# Patient Record
Sex: Female | Born: 1995 | Race: White | Hispanic: No | Marital: Single | Smoking: Former smoker
Health system: Southern US, Community
[De-identification: ages and names within clinical notes are randomized; demographics above are authoritative.]

## PROBLEM LIST (undated history)

## (undated) DIAGNOSIS — F419 Anxiety disorder, unspecified: Secondary | ICD-10-CM

## (undated) DIAGNOSIS — Z789 Other specified health status: Secondary | ICD-10-CM

## (undated) DIAGNOSIS — F32A Depression, unspecified: Secondary | ICD-10-CM

## (undated) DIAGNOSIS — F329 Major depressive disorder, single episode, unspecified: Secondary | ICD-10-CM

---
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
## 2017-02-06 ENCOUNTER — Other Ambulatory Visit (HOSPITAL_COMMUNITY): Payer: Self-pay | Admitting: Psychiatry

## 2017-02-06 DIAGNOSIS — F331 Major depressive disorder, recurrent, moderate: Secondary | ICD-10-CM

## 2017-02-11 ENCOUNTER — Encounter (HOSPITAL_COMMUNITY): Payer: Self-pay | Admitting: Psychiatry

## 2017-02-11 ENCOUNTER — Ambulatory Visit (INDEPENDENT_AMBULATORY_CARE_PROVIDER_SITE_OTHER): Payer: Medicaid Other | Admitting: Psychiatry

## 2017-02-11 VITALS — BP 118/68 | HR 79 | Ht 65.0 in | Wt 161.0 lb

## 2017-02-11 DIAGNOSIS — F419 Anxiety disorder, unspecified: Secondary | ICD-10-CM

## 2017-02-11 DIAGNOSIS — F5101 Primary insomnia: Secondary | ICD-10-CM

## 2017-02-11 DIAGNOSIS — Z818 Family history of other mental and behavioral disorders: Secondary | ICD-10-CM

## 2017-02-11 DIAGNOSIS — F1220 Cannabis dependence, uncomplicated: Secondary | ICD-10-CM

## 2017-02-11 DIAGNOSIS — F17210 Nicotine dependence, cigarettes, uncomplicated: Secondary | ICD-10-CM | POA: Diagnosis not present

## 2017-02-11 DIAGNOSIS — F331 Major depressive disorder, recurrent, moderate: Secondary | ICD-10-CM | POA: Diagnosis not present

## 2017-02-11 MED ORDER — BUSPIRONE HCL 15 MG PO TABS: 15.0000 mg | ORAL_TABLET | Freq: Two times a day (BID) | ORAL | 2 refills | Status: DC

## 2017-02-11 MED ORDER — VENLAFAXINE HCL ER 75 MG PO CP24: 75.0000 mg | ORAL_CAPSULE | Freq: Every day | ORAL | 1 refills | Status: DC

## 2017-02-11 MED ORDER — TRAZODONE HCL 50 MG PO TABS: 50.0000 mg | ORAL_TABLET | Freq: Every day | ORAL | 1 refills | Status: DC

## 2017-02-11 NOTE — Progress Notes (Signed)
BH MD/PA/NP OP Progress Note  02/11/2017 3:16 PM Ariana Burns  MRN:  [id number]  Chief Complaint: med management, transfer care  Subjective:  Ariana Burns presents today for transfer of care from Dr. Lucianne Burns. I spent time getting to know Ariana Burns, and his gender identity. 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    Spent time learning about his mental health history and his areas of struggle. He feels that Effexor is generally effective, but would like to go down on the dose from 150 to 75 due to side effects, namely headache, low sex drive. Reports that he has done perfectly fine in the past with 75 mg XR, and feels that his life is at a stable place and he can tolerate the decrease.  He reports that he uses marijuana every night to go to sleep, so he has been cutting down on his trazodone.   Spent time discussing the risks and benefits of marijuana, and my concerns about any type of substance that comes to be used daily. He was agreeable to continue this discussion at our follow-up visits.  He denies any alcohol use. He reports that the marijuana is helpful for his back pain, low appetite, and sleep. He is agreeable to starting to taper BuSpar and trazodone, given that he notices he doesn't need these is much.  No acute safety issues and agrees to follow-up in 3 months.  He would like to establish therapy, and agrees to a referral to see a provider in this clinic.    Visit Diagnosis:    ICD-10-CM   1. Major depressive disorder, recurrent episode, moderate (HCC) F33.1 venlafaxine XR (EFFEXOR XR) 75 MG 24 hr capsule    busPIRone (BUSPAR) 15 MG tablet  2. Primary insomnia F51.01 traZODone (DESYREL) 50 MG tablet  3. Cannabis use disorder, moderate, dependence (HCC) F12.20     Past Psychiatric History: See intake H&P for full details. Reviewed, with no updates at this time.   Past Medical History:   Past Medical History:  Diagnosis Date  . Medical history non-contributory    History reviewed. No pertinent surgical history.  Family Psychiatric History: See intake H&P for full details. Reviewed, with no updates at this time.   Family History:  Family History  Problem Relation Age of Onset  . Hypertension Maternal Grandfather   . Depression Mother   . Depression Father     Social History:  Social History   Social History  . Marital status: Single    Spouse name: N/A  . Number of children: N/A  . Years of education: N/A   Social History Main Topics  . Smoking status: Current Every Day Smoker    Packs/day: 0.25    Types: Cigarettes  . Smokeless tobacco: Never Used  . Alcohol use No  . Drug use: No  . Sexual activity: No   Other Topics Concern  . None   Social History Narrative  . None    Allergies: No Known Allergies  Metabolic Disorder Labs: No results found for: HGBA1C, MPG Lab Results  Component Value Date   PROLACTIN 25.6 07/11/2014   PROLACTIN 32.8 07/06/2014   Lab Results  Component Value Date   CHOL 127 07/06/2014   TRIG 58 07/06/2014   HDL 51 07/06/2014   CHOLHDL 2.5 07/06/2014   VLDL 12 07/06/2014   LDLCALC 64 07/06/2014     Current Medications: Current Outpatient Prescriptions  Medication Sig Dispense Refill  . busPIRone (BUSPAR) 15 MG tablet Take 1 tablet (15 mg total) by mouth 2 (two) times daily. 30 tablet 2  . testosterone cypionate (DEPOTESTOTERONE CYPIONATE) 100 MG/ML injection Inject into the muscle every 14 (fourteen) days. For IM use only    . traZODone (DESYREL) 50 MG tablet Take 1 tablet (50 mg total) by mouth at bedtime. 90 tablet 1  . venlafaxine XR (EFFEXOR XR) 75 MG 24 hr capsule Take 1 capsule (75 mg total) by mouth daily. 90 capsule 1   No current facility-administered medications for this visit.     Neurologic: Headache: Negative Seizure: Negative Paresthesias: Negative  Musculoskeletal: Strength & Muscle Tone:  within normal limits Gait & Station: normal Patient leans: N/A  Psychiatric Specialty Exam: ROS  Blood pressure 118/68, pulse 79, height 5\' 5"  (1.651 m), weight 161 lb (73 kg).Body mass index is 26.79 kg/m.  General Appearance: Casual and Fairly Groomed  Eye Contact:  Good  Speech:  Clear and Coherent  Volume:  Normal  Mood:  Euthymic  Affect:  Congruent  Thought Process:  Goal Directed  Orientation:  Full (Time, Place, and Person)  Thought Content: Logical   Suicidal Thoughts:  No  Homicidal Thoughts:  No  Memory:  Immediate;   Fair  Judgement:  Fair  Insight:  Fair  Psychomotor Activity:  Normal  Concentration:  Concentration: Fair  Recall:  Fair  Fund of Knowledge: Good  Language: Good  Akathisia:  NA  Handed:  Right  AIMS (if indicated):  0  Assets:  Communication Skills Desire for Improvement Financial Resources/Insurance Housing Social Support Transportation Vocational/Educational  ADL's:  Intact  Cognition: WNL  Sleep:  7-8 hours   Treatment Plan Summary: Ariana Burns, goes by Ariana Burns, Is an 21 year old transgender female, currently on hormone replacement therapy, who presents today for a transfer of his psychiatric care.   He lives with his grandparents nearby and works in [place] [employer].  Spent time today discussing his medication regimen, and learning about his social life and future goals.  He is currently in a relationship with a female partner.  No acute safety issues, and he would like to establish therapy care to work on coping strategies, and learning self growth techniques.  Agrees to follow-up in 3 months.  1. Major depressive disorder, recurrent episode, moderate (HCC)   2. Primary insomnia   3. Cannabis use disorder, moderate, dependence (HCC)     Ongoing discussion of cannabis use Continue Effexor 75 mg XR daily Trazodone 25-50 mg nightly at bedtime BuSpar 15-30 mg daily for anxiety Return to clinic in 3 months Therapy follow-up in this  office  Burnard LeighAlexander Arya Shernita Rabinovich, MD 02/11/2017, 3:16 PM

## 2017-03-13 ENCOUNTER — Other Ambulatory Visit: Payer: Self-pay | Admitting: Internal Medicine

## 2017-03-13 DIAGNOSIS — M546 Pain in thoracic spine: Secondary | ICD-10-CM

## 2017-03-13 DIAGNOSIS — M419 Scoliosis, unspecified: Secondary | ICD-10-CM

## 2017-03-26 ENCOUNTER — Other Ambulatory Visit: Payer: Self-pay

## 2017-04-03 ENCOUNTER — Ambulatory Visit (HOSPITAL_COMMUNITY): Payer: Self-pay | Admitting: Psychology

## 2017-04-03 ENCOUNTER — Encounter (HOSPITAL_COMMUNITY): Payer: Self-pay | Admitting: Psychology

## 2017-04-03 NOTE — Progress Notes (Signed)
Ariana CrazeCierra Monteith is a 21 y.o. transgender female patient who didn't show for his appointment.  Letter sent.        Forde RadonYATES,Patriciaann Rabanal, LPC

## 2017-05-14 ENCOUNTER — Ambulatory Visit (HOSPITAL_COMMUNITY): Payer: Self-pay | Admitting: Psychiatry

## 2017-06-17 ENCOUNTER — Other Ambulatory Visit (HOSPITAL_COMMUNITY): Payer: Self-pay

## 2017-06-17 DIAGNOSIS — F331 Major depressive disorder, recurrent, moderate: Secondary | ICD-10-CM

## 2017-06-17 MED ORDER — BUSPIRONE HCL 15 MG PO TABS: 15.0000 mg | ORAL_TABLET | Freq: Two times a day (BID) | ORAL | 2 refills | Status: DC

## 2017-08-05 ENCOUNTER — Other Ambulatory Visit (HOSPITAL_COMMUNITY): Payer: Self-pay

## 2017-08-05 DIAGNOSIS — F5101 Primary insomnia: Secondary | ICD-10-CM

## 2017-08-05 DIAGNOSIS — F331 Major depressive disorder, recurrent, moderate: Secondary | ICD-10-CM

## 2017-08-05 MED ORDER — VENLAFAXINE HCL ER 75 MG PO CP24
75.0000 mg | ORAL_CAPSULE | Freq: Every day | ORAL | 0 refills | Status: DC
Start: 2017-08-05 — End: 2017-10-29

## 2017-08-05 MED ORDER — VENLAFAXINE HCL ER 75 MG PO CP24: 75.0000 mg | ORAL_CAPSULE | Freq: Every day | ORAL | 0 refills | Status: DC

## 2017-08-05 MED ORDER — TRAZODONE HCL 50 MG PO TABS: 50.0000 mg | ORAL_TABLET | Freq: Every day | ORAL | 0 refills | Status: DC

## 2017-09-05 ENCOUNTER — Telehealth (HOSPITAL_COMMUNITY): Payer: Self-pay

## 2017-09-05 NOTE — Telephone Encounter (Signed)
We can offer vistaril 25 mg TID for the time being. If there is a higher dose of buspar available we can consider that as well.

## 2017-09-05 NOTE — Telephone Encounter (Signed)
Patient is calling for a refill on his Buspar, I would normally fill this as protocol but Buspar is currently unavailable. Is there something else you would like to try?

## 2017-09-06 ENCOUNTER — Ambulatory Visit (HOSPITAL_COMMUNITY): Payer: Self-pay | Admitting: Psychiatry

## 2017-10-24 ENCOUNTER — Other Ambulatory Visit: Payer: Self-pay

## 2017-10-24 ENCOUNTER — Emergency Department (HOSPITAL_COMMUNITY)
Admission: EM | Admit: 2017-10-24 | Discharge: 2017-10-24 | Disposition: A | Discharge status: Home / Self Care | Payer: Self-pay | Attending: Emergency Medicine | Admitting: Emergency Medicine

## 2017-10-24 ENCOUNTER — Encounter (HOSPITAL_COMMUNITY): Payer: Self-pay | Admitting: Nurse Practitioner

## 2017-10-24 DIAGNOSIS — Z5321 Procedure and treatment not carried out due to patient leaving prior to being seen by health care provider: Secondary | ICD-10-CM | POA: Insufficient documentation

## 2017-10-24 DIAGNOSIS — R1031 Right lower quadrant pain: Secondary | ICD-10-CM | POA: Insufficient documentation

## 2017-10-24 DIAGNOSIS — R1084 Generalized abdominal pain: Secondary | ICD-10-CM | POA: Insufficient documentation

## 2017-10-24 HISTORY — DX: Major depressive disorder, single episode, unspecified: F32.9

## 2017-10-24 HISTORY — DX: Depression, unspecified: F32.A

## 2017-10-24 HISTORY — DX: Anxiety disorder, unspecified: F41.9

## 2017-10-24 NOTE — ED Triage Notes (Signed)
Patient started having right sided flank and lower right quad pain this AM. Patient stated it comes in waves of sharp pain. Patient states his flow of urine is not a strong and has not been urinating as much. When the pain hit he gets nauseous.

## 2017-10-24 NOTE — ED Triage Notes (Signed)
Pt called with no answer

## 2017-10-25 ENCOUNTER — Other Ambulatory Visit: Payer: Self-pay

## 2017-10-25 ENCOUNTER — Ambulatory Visit (HOSPITAL_COMMUNITY)
Admission: EM | Admit: 2017-10-25 | Discharge: 2017-10-25 | Disposition: A | Discharge status: Home / Self Care | Payer: Self-pay | Attending: Internal Medicine | Admitting: Internal Medicine

## 2017-10-25 ENCOUNTER — Encounter (HOSPITAL_COMMUNITY): Payer: Self-pay | Admitting: Emergency Medicine

## 2017-10-25 DIAGNOSIS — F419 Anxiety disorder, unspecified: Secondary | ICD-10-CM | POA: Insufficient documentation

## 2017-10-25 DIAGNOSIS — R109 Unspecified abdominal pain: Secondary | ICD-10-CM | POA: Insufficient documentation

## 2017-10-25 DIAGNOSIS — F640 Transsexualism: Secondary | ICD-10-CM | POA: Insufficient documentation

## 2017-10-25 DIAGNOSIS — F329 Major depressive disorder, single episode, unspecified: Secondary | ICD-10-CM | POA: Insufficient documentation

## 2017-10-25 DIAGNOSIS — Z79899 Other long term (current) drug therapy: Secondary | ICD-10-CM | POA: Insufficient documentation

## 2017-10-25 DIAGNOSIS — F17210 Nicotine dependence, cigarettes, uncomplicated: Secondary | ICD-10-CM | POA: Insufficient documentation

## 2017-10-25 LAB — POCT URINALYSIS DIP (DEVICE)
Bilirubin Urine: NEGATIVE
Glucose, UA: NEGATIVE mg/dL
Ketones, ur: 40 mg/dL — AB
NITRITE: NEGATIVE
PH: 6.5 (ref 5.0–8.0)
PROTEIN: 30 mg/dL — AB
SPECIFIC GRAVITY, URINE: 1.02 (ref 1.005–1.030)
UROBILINOGEN UA: 0.2 mg/dL (ref 0.0–1.0)

## 2017-10-25 MED ORDER — SULFAMETHOXAZOLE-TRIMETHOPRIM 800-160 MG PO TABS: 1.0000 | ORAL_TABLET | Freq: Two times a day (BID) | ORAL | 0 refills | Status: AC

## 2017-10-25 NOTE — ED Triage Notes (Signed)
C/o right flank pain, denies dysuria onset yesterday

## 2017-10-25 NOTE — Discharge Instructions (Addendum)
Recommend start Bactrim 1 tablet twice a day as directed. Increase fluid intake. May continue Ibuprofen 600mg  or Tylenol 1000mg  every 8 hours as needed for pain. Follow-up pending urine culture results or go to the ER if pain does not improve within 48 hours.

## 2017-10-25 NOTE — ED Provider Notes (Signed)
[facility]    CSN: [id number] Arrival date & time: 10/25/17  1009     History   Chief Complaint Chief Complaint  Patient presents with  . Flank Pain    HPI Ariana Burns is a 22 y.o. adult.   22 year old female presents with right lower side to flank pain that started about 3 to 4 days ago. Began as a dull ache but now having more significant pain, especially toward his back since yesterday. May have had a low grade fever last night. Also has noticed decrease in urinary output but no blood in his urine. Denies any distinct pain with urination or discharge. Has taken Ibuprofen with minimal relief. No history of UTI or kidney stones. Other chronic health issues include anxiety and depression along with gender reassignment. No longer taking Testosterone but currently on Effexor and Trazodone daily.   The history is provided by the patient and a caregiver.    Past Medical History:  Diagnosis Date  . Anxiety   . Depression   . Medical history non-contributory     Patient Active Problem List   Diagnosis Date Noted  . Major depressive disorder, recurrent, severe without psychotic features (HCC)   . 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 MDD (major depressive disorder), recurrent episode, severe (HCC) 01/18/2014  . Generalized anxiety disorder 06/30/2013    History reviewed. No pertinent surgical history.  OB History    Gravida  0   Para      Term      Preterm      AB      Living        SAB      TAB      Ectopic      Multiple      Live Births               Home Medications    Prior to Admission medications   Medication Sig Start Date End Date Taking? Authorizing Provider  sulfamethoxazole-trimethoprim (BACTRIM DS,SEPTRA DS) 800-160 MG tablet Take 1 tablet by mouth 2 (two) times daily for 7 days. 10/25/17 11/01/17  Sudie Grumbling, NP  traZODone (DESYREL) 50 MG tablet Take 1 tablet (50 mg total) by mouth at bedtime. 08/05/17    Burnard Leigh, MD  venlafaxine XR (EFFEXOR XR) 75 MG 24 hr capsule Take 1 capsule (75 mg total) by mouth daily. 08/05/17 08/05/18  Burnard Leigh, MD    Family History Family History  Problem Relation Age of Onset  . Hypertension Maternal Grandfather   . Depression Mother   . Depression Father     Social History Social History   Tobacco Use  . Smoking status: Current Every Day Smoker    Packs/day: 0.25    Types: Cigarettes  . Smokeless tobacco: Never Used  Substance Use Topics  . Alcohol use: No  . Drug use: Yes    Types: Marijuana     Allergies   Patient has no known allergies.   Review of Systems Review of Systems  Constitutional: Positive for appetite change, fatigue and fever. Negative for activity change and chills.  HENT: Negative for congestion, mouth sores and sore throat.   Respiratory: Negative for cough, chest tightness, shortness of breath and wheezing.   Gastrointestinal: Positive for abdominal pain. Negative for constipation, diarrhea, nausea and vomiting.  Genitourinary: Positive for decreased urine volume and flank pain. Negative for difficulty urinating, dysuria, genital sores, hematuria and urgency.  Musculoskeletal: Positive for back pain. Negative for arthralgias and myalgias.  Skin: Negative for rash and wound.  Neurological: Negative for dizziness, tremors, seizures, syncope, weakness, light-headedness, numbness and headaches.  Hematological: Negative for adenopathy. Does not bruise/bleed easily.     Physical Exam Triage Vital Signs ED Triage Vitals  Enc Vitals Group     BP 10/25/17 1105 112/72     Pulse Rate 10/25/17 1105 (!) 59     Resp --      Temp 10/25/17 1105 98.3 F (36.8 C)     Temp Source 10/25/17 1105 Oral     SpO2 10/25/17 1105 100 %     Weight --      Height --      Head Circumference --      Peak Flow --      Pain Score 10/25/17 1103 7     Pain Loc --      Pain Edu? --      Excl. in GC? --    No data  found.  Updated Vital Signs BP 112/72 (BP Location: Left Arm)   Pulse (!) 59   Temp 98.3 F (36.8 C) (Oral)   SpO2 100%   Visual Acuity Right Eye Distance:   Left Eye Distance:   Bilateral Distance:    Right Eye Near:   Left Eye Near:    Bilateral Near:     Physical Exam  Constitutional: She is oriented to person, place, and time. She appears well-developed and well-nourished. She is cooperative. No distress.  Patient sitting comfortably on exam table in no acute distress.   HENT:  Head: Normocephalic and atraumatic.  Right Ear: External ear normal.  Left Ear: External ear normal.  Mouth/Throat: Oropharynx is clear and moist.  Eyes: Conjunctivae and EOM are normal.  Neck: Normal range of motion.  Cardiovascular: Normal rate, regular rhythm and normal heart sounds.  No murmur heard. Pulmonary/Chest: Effort normal and breath sounds normal. No respiratory distress.  Abdominal: Soft. Normal appearance and bowel sounds are normal. She exhibits no distension and no mass. There is tenderness in the right upper quadrant, right lower quadrant and suprapubic area. There is CVA tenderness (slight right). There is no rigidity, no rebound and no guarding.  Musculoskeletal: Normal range of motion.  Neurological: She is alert and oriented to person, place, and time.  Skin: Skin is warm and dry. No rash noted.  Psychiatric: She has a normal mood and affect. Her speech is normal and behavior is normal. Judgment and thought content normal. Cognition and memory are normal.  Vitals reviewed.    UC Treatments / Results  Labs (all labs ordered are listed, but only abnormal results are displayed) Labs Reviewed  POCT URINALYSIS DIP (DEVICE) - Abnormal; Notable for the following components:      Result Value   Ketones, ur 40 (*)    Hgb urine dipstick TRACE (*)    Protein, ur 30 (*)    Leukocytes, UA TRACE (*)    All other components within normal limits  URINE CULTURE     EKG None Radiology No results found.  Procedures Procedures (including critical care time)  Medications Ordered in UC Medications - No data to display   Initial Impression / Assessment and Plan / UC Course  I have reviewed the triage vital signs and the nursing notes.  Pertinent labs & imaging results that were available during my care of the patient were reviewed by me and considered in my medical decision  making (see chart for details).    Reviewed urinalysis results with patient- possible UTI but other etiologies include kidney stones and appendicitis. Continue to monitor symptoms and pain. Recommend start Bactrim DS 1 tablet twice a day as directed. Increase fluid intake. May continue Ibuprofen 600mg  or Tylenol 1000mg  every 8 hours as needed for pain. Note written for work. Follow-up pending urine culture results or go to the ER if pain does not improve within 48 hours or sooner if pain worsens.    Final Clinical Impressions(s) / UC Diagnoses   Final diagnoses:  Right flank pain    ED Discharge Orders        Ordered    sulfamethoxazole-trimethoprim (BACTRIM DS,SEPTRA DS) 800-160 MG tablet  2 times daily     10/25/17 1210       Controlled Substance Prescriptions [place] Controlled Substance Registry consulted? Not Applicable   Sudie Grumbling, NP 10/25/17 2342

## 2017-10-26 LAB — URINE CULTURE

## 2017-10-28 ENCOUNTER — Telehealth (HOSPITAL_COMMUNITY): Payer: Self-pay

## 2017-10-28 NOTE — Telephone Encounter (Signed)
Pt contacted regarding urine culture not clearly demonstrate a UTI.  Educated on other possible causes of urinary discomfort include chafing; irritation from hygiene product; other pelvic infection (yeast, bacterial vaginosis) or STD; occasional dietary cause (caffeine); bowel issue (constipation); low estrogen effect; kidney stone passage; or interstitial cystitis.  Recheck or followup with your primary care provider for further evaluation if symptoms are not improving. Answered all questions.

## 2017-10-29 ENCOUNTER — Ambulatory Visit (INDEPENDENT_AMBULATORY_CARE_PROVIDER_SITE_OTHER): Payer: Self-pay | Admitting: Psychiatry

## 2017-10-29 ENCOUNTER — Encounter (HOSPITAL_COMMUNITY): Payer: Self-pay | Admitting: Psychiatry

## 2017-10-29 DIAGNOSIS — F17210 Nicotine dependence, cigarettes, uncomplicated: Secondary | ICD-10-CM

## 2017-10-29 DIAGNOSIS — Z818 Family history of other mental and behavioral disorders: Secondary | ICD-10-CM

## 2017-10-29 DIAGNOSIS — F331 Major depressive disorder, recurrent, moderate: Secondary | ICD-10-CM

## 2017-10-29 DIAGNOSIS — F5101 Primary insomnia: Secondary | ICD-10-CM

## 2017-10-29 MED ORDER — VENLAFAXINE HCL ER 75 MG PO CP24: 75.0000 mg | ORAL_CAPSULE | Freq: Every day | ORAL | 3 refills | Status: DC

## 2017-10-29 MED ORDER — TRAZODONE HCL 50 MG PO TABS: 50.0000 mg | ORAL_TABLET | Freq: Every day | ORAL | 3 refills | Status: DC

## 2017-10-29 NOTE — Progress Notes (Signed)
[facility] MD/PA/NP OP Progress Note  10/29/2017 3:30 PM Ariana Burns  MRN:  [id number]  Chief Complaint: med management HPI: Ariana Burns reports that overall mood and anxiety have been managed fairly well.  No longer requiring the use of BuSpar, Effexor and trazodone seem to be effective for his mood and sleep symptoms. denies any significant changes in social circle.  Has been working at a new job which has been a positive change.  He reports that his hormone therapies have been completed, and he is off testosterone now.  We will follow-up in 6-12 months or sooner if needed.  Visit Diagnosis:    ICD-10-CM   1. Primary insomnia F51.01 traZODone (DESYREL) 50 MG tablet  2. Major depressive disorder, recurrent episode, moderate (HCC) F33.1 venlafaxine XR (EFFEXOR XR) 75 MG 24 hr capsule    Past Psychiatric History: See intake H&P for full details. Reviewed, with no updates at this time.   Past Medical History:  Past Medical History:  Diagnosis Date  . Anxiety   . Depression   . Medical history non-contributory    No past surgical history on file.  Family Psychiatric History: See intake H&P for full details. Reviewed, with no updates at this time.   Family History:  Family History  Problem Relation Age of Onset  . Hypertension Maternal Grandfather   . Depression Mother   . Depression Father     Social History:  Social History   Socioeconomic History  . Marital status: Single    Spouse name: Not on file  . Number of children: Not on file  . Years of education: Not on file  . Highest education level: Not on file  Occupational History  . Not on file  Social Needs  . Financial resource strain: Not on file  . Food insecurity:    Worry: Not on file    Inability: Not on file  . Transportation needs:    Medical: Not on file    Non-medical: Not on file  Tobacco Use  . Smoking status: Current Every Day Smoker    Packs/day: 0.25    Types: Cigarettes  . Smokeless tobacco: Never  Used  Substance and Sexual Activity  . Alcohol use: No  . Drug use: Yes    Types: Marijuana  . Sexual activity: Never  Lifestyle  . Physical activity:    Days per week: Not on file    Minutes per session: Not on file  . Stress: Not on file  Relationships  . Social connections:    Talks on phone: Not on file    Gets together: Not on file    Attends religious service: Not on file    Active member of club or organization: Not on file    Attends meetings of clubs or organizations: Not on file    Relationship status: Not on file  Other Topics Concern  . Not on file  Social History Narrative  . Not on file    Allergies: No Known Allergies  Metabolic Disorder Labs: No results found for: HGBA1C, MPG Lab Results  Component Value Date   PROLACTIN 25.6 07/11/2014   PROLACTIN 32.8 07/06/2014   Lab Results  Component Value Date   CHOL 127 07/06/2014   TRIG 58 07/06/2014   HDL 51 07/06/2014   CHOLHDL 2.5 07/06/2014   VLDL 12 07/06/2014   LDLCALC 64 07/06/2014   Lab Results  Component Value Date   TSH 1.680 07/06/2014   TSH 4.446 06/30/2013  Therapeutic Level Labs: No results found for: LITHIUM No results found for: VALPROATE No components found for:  CBMZ  Current Medications: Current Outpatient Medications  Medication Sig Dispense Refill  . sulfamethoxazole-trimethoprim (BACTRIM DS,SEPTRA DS) 800-160 MG tablet Take 1 tablet by mouth 2 (two) times daily for 7 days. 14 tablet 0  . traZODone (DESYREL) 50 MG tablet Take 1 tablet (50 mg total) by mouth at bedtime. 90 tablet 3  . venlafaxine XR (EFFEXOR XR) 75 MG 24 hr capsule Take 1 capsule (75 mg total) by mouth daily. 90 capsule 3   No current facility-administered medications for this visit.      Musculoskeletal: Strength & Muscle Tone: within normal limits Gait & Station: normal Patient leans: N/A  Psychiatric Specialty Exam: ROS  There were no vitals taken for this visit.There is no height or weight on  file to calculate BMI.  General Appearance: Casual and Well Groomed  Eye Contact:  Good  Speech:  Clear and Coherent and Normal Rate  Volume:  Normal  Mood:  Euthymic  Affect:  Appropriate and Congruent  Thought Process:  Coherent, Goal Directed and Descriptions of Associations: Intact  Orientation:  Full (Time, Place, and Person)  Thought Content: Logical   Suicidal Thoughts:  No  Homicidal Thoughts:  No  Memory:  Immediate;   Good  Judgement:  Good  Insight:  Good  Psychomotor Activity:  Normal  Concentration:  Concentration: Good  Recall:  Good  Fund of Knowledge: Good  Language: Good  Akathisia:  Negative  Handed:  Right  AIMS (if indicated): not done  Assets:  Communication Skills Desire for Improvement Financial Resources/Insurance Housing  ADL's:  Intact  Cognition: WNL  Sleep:  Good   Screenings: AUDIT     Admission (Discharged) [facility]  Alcohol Use Disorder Identification Test Final Score (AUDIT)  0       Assessment and Plan:  Ariana Burns presents with overall stability of insomnia and depression.  We will continue the current medication regimen and follow-up in 6-12 months.  Provided information on resources including mental health [place], as he has limited time to participate in scheduled individual therapy at this time, given his busy work schedule.  1. Primary insomnia   2. Major depressive disorder, recurrent episode, moderate (HCC)     Status of current problems: stable  Labs Ordered: No orders of the defined types were placed in this encounter.   Labs Reviewed: n/a  Collateral Obtained/Records Reviewed: n/a  Plan:  Continue Effexor 75 mg xr daily Trazodone 50 mg nightly  I spent 15 minutes with the patient in direct face-to-face clinical care.  Greater than 50% of this time was spent in counseling and coordination of care with the patient.    Burnard LeighAlexander Arya  Alfreda Hammad, MD 10/29/2017, 3:30 PM

## 2017-11-07 ENCOUNTER — Other Ambulatory Visit (HOSPITAL_COMMUNITY): Payer: Self-pay

## 2017-11-07 DIAGNOSIS — F331 Major depressive disorder, recurrent, moderate: Secondary | ICD-10-CM

## 2017-11-07 MED ORDER — VENLAFAXINE HCL ER 75 MG PO CP24: 75.0000 mg | ORAL_CAPSULE | Freq: Every day | ORAL | 3 refills | Status: DC

## 2018-01-30 ENCOUNTER — Encounter (HOSPITAL_COMMUNITY): Payer: Self-pay | Admitting: Emergency Medicine

## 2018-01-30 ENCOUNTER — Other Ambulatory Visit: Payer: Self-pay

## 2018-01-30 ENCOUNTER — Ambulatory Visit (HOSPITAL_COMMUNITY)
Admission: EM | Admit: 2018-01-30 | Discharge: 2018-01-30 | Disposition: A | Discharge status: Home / Self Care | Payer: Self-pay | Attending: Family Medicine | Admitting: Family Medicine

## 2018-01-30 DIAGNOSIS — N644 Mastodynia: Secondary | ICD-10-CM

## 2018-01-30 DIAGNOSIS — N63 Unspecified lump in unspecified breast: Secondary | ICD-10-CM

## 2018-01-30 NOTE — ED Notes (Signed)
Bed: UC01 Expected date:  Expected time:  Means of arrival:  Comments: 

## 2018-01-30 NOTE — ED Provider Notes (Signed)
[facility]   [id number] 01/30/18 Arrival Time: 1358  SUBJECTIVE:  Ariana Burns is a 22 y.o. adult who presents with a breast mass that began 1 week ago.  Denies trauma, or precipitating event.  Wears binders.  Localizes the mass to left breast.  Describes it as intermittently painful.  Denies aggravating or alleviating factors.  Denies similar symptoms in the past.  Denies fever, chills, nausea, vomiting, erythema, skin changes, dimpling, nipple discharge, axillary LAD, SOB, chest pain, abdominal pain, changes in bowel or bladder function.    Patient is transitioning from female-to-female.  Recently started having menstrual cycles again after stopping testosterone earlier this year.  LMP 2-3 weeks ago.    ROS: As per HPI.  Past Medical History:  Diagnosis Date  . Anxiety   . Depression   . Medical history non-contributory    History reviewed. No pertinent surgical history. No Known Allergies No current facility-administered medications on file prior to encounter.    Current Outpatient Medications on File Prior to Encounter  Medication Sig Dispense Refill  . traZODone (DESYREL) 50 MG tablet Take 1 tablet (50 mg total) by mouth at bedtime. 90 tablet 3  . venlafaxine XR (EFFEXOR XR) 75 MG 24 hr capsule Take 1 capsule (75 mg total) by mouth daily. 90 capsule 3   Social History   Socioeconomic History  . Marital status: Single    Spouse name: Not on file  . Number of children: Not on file  . Years of education: Not on file  . Highest education level: Not on file  Occupational History  . Not on file  Social Needs  . Financial resource strain: Not on file  . Food insecurity:    Worry: Not on file    Inability: Not on file  . Transportation needs:    Medical: Not on file    Non-medical: Not on file  Tobacco Use  . Smoking status: Current Every Day Smoker    Packs/day: 0.25    Types: Cigarettes  . Smokeless tobacco: Never Used  Substance and Sexual Activity  .  Alcohol use: No  . Drug use: Yes    Types: Marijuana  . Sexual activity: Never  Lifestyle  . Physical activity:    Days per week: Not on file    Minutes per session: Not on file  . Stress: Not on file  Relationships  . Social connections:    Talks on phone: Not on file    Gets together: Not on file    Attends religious service: Not on file    Active member of club or organization: Not on file    Attends meetings of clubs or organizations: Not on file    Relationship status: Not on file  . Intimate partner violence:    Fear of current or ex partner: Not on file    Emotionally abused: Not on file    Physically abused: Not on file    Forced sexual activity: Not on file  Other Topics Concern  . Not on file  Social History Narrative  . Not on file   Family History  Problem Relation Age of Onset  . Hypertension Maternal Grandfather   . Depression Mother   . Depression Father     OBJECTIVE: Vitals:   01/30/18 1409  BP: 114/76  Pulse: 75  Resp: 16  Temp: 98.2 F (36.8 C)  TempSrc: Oral  SpO2: 98%    General appearance: alert; no distress Lungs: clear to auscultation bilaterally  Heart: regular rate and rhythm.  Radial pulse 2+ bilaterally Chest: Breast appear symmetrical without obvious masses, dimpling, or skin changes; left breast palpated with ropey breast tissue appreciated in the top outer quadrant, possible <0.5cm soft movable mass in the 2-3 o' clock position, mildly tender with deep palpation; right breast palpated with similar ropey breast tissues in the top upper and lower quadrants; nipples without obvious discharge; no axillary LAD Extremities: no edema Skin: warm and dry Psychological: alert and cooperative; normal mood and affect  ASSESSMENT & PLAN:  1. Breast lump in upper outer quadrant   2. Breast pain     No orders of the defined types were placed in this encounter.  I am recommending additional imaging for further evaluation and  management Please follow up with PCP following the results of your test(s) Primary care provider assistance initiated to establish care Return or go to the ED if you have any new or worsening symptoms  Reviewed expectations re: course of current medical issues. Questions answered. Outlined signs and symptoms indicating need for more acute intervention. Patient verbalized understanding. After Visit Summary given.   Rennis HardingWurst, Jalynne Persico, PA-C 01/30/18 1726

## 2018-01-30 NOTE — Discharge Instructions (Signed)
I am recommending additional imaging for further evaluation and management Please follow up with PCP following the results of your test(s) Primary care provider assistance initiated to establish care Return or go to the ED if you have any new or worsening symptoms

## 2018-01-30 NOTE — ED Triage Notes (Signed)
Noticed knot in left breast approximately one week ago.  Patient says the knot has changed shape

## 2018-03-17 ENCOUNTER — Encounter (HOSPITAL_COMMUNITY): Payer: Self-pay

## 2018-03-17 ENCOUNTER — Ambulatory Visit (HOSPITAL_COMMUNITY)
Admission: EM | Admit: 2018-03-17 | Discharge: 2018-03-17 | Disposition: A | Discharge status: Home / Self Care | Payer: Medicaid Other | Attending: Family Medicine | Admitting: Family Medicine

## 2018-03-17 DIAGNOSIS — J029 Acute pharyngitis, unspecified: Secondary | ICD-10-CM

## 2018-03-17 MED ORDER — PHENOL 1.4 % MT LIQD: OROMUCOSAL | 0 refills | Status: DC

## 2018-03-17 NOTE — ED Notes (Signed)
Bed: UC01 Expected date: 03/17/18 Expected time:  Means of arrival:  Comments: For APPTS  

## 2018-03-17 NOTE — Discharge Instructions (Addendum)
Exam and vital signs are reassuring today. This is likely viral and I would expect gradual improvement over the next week. Throat lozenges, gargles, chloraseptic spray, warm teas, popsicles etc to help with throat pain.   Tylenol and/or ibuprofen as needed for pain or fevers.   Push fluids to ensure adequate hydration and keep secretions thin.   If symptoms worsen or do not improve in the next week to return to be seen or to follow up with your PCP.

## 2018-03-17 NOTE — ED Triage Notes (Signed)
Pt presents with complaints of sore throat since Friday along with episode of emesis. Patient denies any fevers.

## 2018-03-17 NOTE — ED Provider Notes (Signed)
[facility]    CSN: [id number] Arrival date & time: 03/17/18  1153     History   Chief Complaint Chief Complaint  Patient presents with  . Appointment    1200  . Sore Throat    HPI Ariana Burns is a 22 y.o. adult.   Ariana Burns presents with complaints of sore throat which started 8/23. No fevers. Has not worsened. Had some left ear pain which resolved today. No cough or congestion that is new or worse. Does smoke, states has a chronic cough related to this. Vomited on 8/23 once but states feels this is unrelated as has had intermittent "stomach issues lately" as is under a lot of stress. Has not taken any medications for symptoms. No known ill contacts. No rash. Denies any previous similar. Hx of anxiety depression.    ROS per HPI.      Past Medical History:  Diagnosis Date  . Anxiety   . Depression   . Medical history non-contributory     Patient Active Problem List   Diagnosis Date Noted  . Major depressive disorder, recurrent, severe without psychotic features (HCC)   . 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 MDD (major depressive disorder), recurrent episode, severe (HCC) 01/18/2014  . Generalized anxiety disorder 06/30/2013    History reviewed. No pertinent surgical history.  OB History    Gravida  0   Para      Term      Preterm      AB      Living        SAB      TAB      Ectopic      Multiple      Live Births               Home Medications    Prior to Admission medications   Medication Sig Start Date End Date Taking? Authorizing Provider  ranitidine (ZANTAC) 150 MG tablet Take 150 mg by mouth 2 (two) times daily.   Yes [provider]  traZODone (DESYREL) 50 MG tablet Take 1 tablet (50 mg total) by mouth at bedtime. 10/29/17  Yes Eksir, Bo Mcclintock, MD  venlafaxine XR (EFFEXOR XR) 75 MG 24 hr capsule Take 1 capsule (75 mg total) by mouth daily. 11/07/17 11/07/18 Yes Eksir, Bo Mcclintock, MD    phenol (CHLORASEPTIC GARGLE) 1.4 % LIQD Gargle in throat and spit out as needed for sore throat 03/17/18   Georgetta Haber, NP    Family History Family History  Problem Relation Age of Onset  . Hypertension Maternal Grandfather   . Depression Mother   . Depression Father     Social History Social History   Tobacco Use  . Smoking status: Current Every Day Smoker    Packs/day: 0.25    Types: Cigarettes  . Smokeless tobacco: Never Used  Substance Use Topics  . Alcohol use: No  . Drug use: Yes    Types: Marijuana     Allergies   Patient has no known allergies.   Review of Systems Review of Systems   Physical Exam Triage Vital Signs ED Triage Vitals  Enc Vitals Group     BP 03/17/18 1206 115/68     Pulse Rate 03/17/18 1206 70     Resp 03/17/18 1206 16     Temp 03/17/18 1206 98.5 F (36.9 C)     Temp Source 03/17/18 1206 Oral     SpO2 03/17/18 1206  100 %     Weight --      Height --      Head Circumference --      Peak Flow --      Pain Score 03/17/18 1213 4     Pain Loc --      Pain Edu? --      Excl. in GC? --    No data found.  Updated Vital Signs BP 115/68 (BP Location: Right Arm)   Pulse 70   Temp 98.5 F (36.9 C) (Oral)   Resp 16   LMP 02/24/2018   SpO2 100%    Physical Exam  Constitutional: She is oriented to person, place, and time. She appears well-developed and well-nourished. No distress.  HENT:  Head: Normocephalic and atraumatic.  Right Ear: Tympanic membrane, external ear and ear canal normal.  Left Ear: Tympanic membrane, external ear and ear canal normal.  Nose: Nose normal.  Mouth/Throat: Uvula is midline, oropharynx is clear and moist and mucous membranes are normal. Tonsils are 1+ on the right. Tonsils are 1+ on the left. No tonsillar exudate.  Eyes: Pupils are equal, round, and reactive to light. Conjunctivae and EOM are normal.  Cardiovascular: Normal rate, regular rhythm and normal heart sounds.  Pulmonary/Chest: Effort  normal and breath sounds normal.  Lymphadenopathy:    She has no cervical adenopathy.  Neurological: She is alert and oriented to person, place, and time.  Skin: Skin is warm and dry.     UC Treatments / Results  Labs (all labs ordered are listed, but only abnormal results are displayed) Labs Reviewed - No data to display  EKG None  Radiology No results found.  Procedures Procedures (including critical care time)  Medications Ordered in UC Medications - No data to display  Initial Impression / Assessment and Plan / UC Course  I have reviewed the triage vital signs and the nursing notes.  Pertinent labs & imaging results that were available during my care of the patient were reviewed by me and considered in my medical decision making (see chart for details).     Benign physical exam. Non toxic in appearance. Afebrile. Offered strep swab for patient and patient unable to complete and declines. History and physical consistent with viral illness.  Supportive cares recommended. Return precautions provided. Patient verbalized understanding and agreeable to plan.    Final Clinical Impressions(s) / UC Diagnoses   Final diagnoses:  Pharyngitis, unspecified etiology     Discharge Instructions     Exam and vital signs are reassuring today. This is likely viral and I would expect gradual improvement over the next week. Throat lozenges, gargles, chloraseptic spray, warm teas, popsicles etc to help with throat pain.   Tylenol and/or ibuprofen as needed for pain or fevers.   Push fluids to ensure adequate hydration and keep secretions thin.   If symptoms worsen or do not improve in the next week to return to be seen or to follow up with your PCP.     ED Prescriptions    Medication Sig Dispense Auth. Provider   phenol (CHLORASEPTIC GARGLE) 1.4 % LIQD Gargle in throat and spit out as needed for sore throat 1 Bottle Georgetta HaberBurky, Natalie B, NP     Controlled Substance Prescriptions [place]  Controlled Substance Registry consulted? Not Applicable   Georgetta HaberBurky, Natalie B, NP 03/17/18 1233

## 2018-04-30 ENCOUNTER — Ambulatory Visit (HOSPITAL_COMMUNITY): Payer: Self-pay | Admitting: Psychiatry

## 2018-05-19 ENCOUNTER — Encounter (HOSPITAL_COMMUNITY): Payer: Self-pay | Admitting: Emergency Medicine

## 2018-05-19 ENCOUNTER — Ambulatory Visit (HOSPITAL_COMMUNITY)
Admission: EM | Admit: 2018-05-19 | Discharge: 2018-05-19 | Disposition: A | Discharge status: Home / Self Care | Payer: Medicaid Other | Attending: Family Medicine | Admitting: Family Medicine

## 2018-05-19 ENCOUNTER — Other Ambulatory Visit: Payer: Self-pay

## 2018-05-19 DIAGNOSIS — K0889 Other specified disorders of teeth and supporting structures: Secondary | ICD-10-CM

## 2018-05-19 MED ORDER — IBUPROFEN 800 MG PO TABS: 800.0000 mg | ORAL_TABLET | Freq: Three times a day (TID) | ORAL | 0 refills | Status: DC

## 2018-05-19 MED ORDER — PENICILLIN V POTASSIUM 500 MG PO TABS: 500.0000 mg | ORAL_TABLET | Freq: Four times a day (QID) | ORAL | 0 refills | Status: AC

## 2018-05-19 NOTE — Discharge Instructions (Signed)
Complete course of antibiotics.  Tylenol and/or ibuprofen as needed for pain or fevers. Ice application to face/jaw may also be helpful.  Gargle after eating to prevent food entrapment to wisdom teeth.  Please follow up with dentist for definitive treatment.

## 2018-05-19 NOTE — ED Triage Notes (Signed)
Complains of wisdom tooth pain for a few months.  Patient says there is a cavity on right .

## 2018-05-19 NOTE — ED Provider Notes (Signed)
[facility]    CSN: [id number] Arrival date & time: 05/19/18  1604     History   Chief Complaint Chief Complaint  Patient presents with  . Appointment    4:00pm  . Dental Pain    HPI Ariana Burns is a 22 y.o. adult.   [name] presents with complaints of dental pain which has been worsening over the past 3-4 days. States has had some intermittent pain for the past month due to suspected cavity, but it has worsened over the past few days. States also has bilateral upper wisdom teeth growing in which have been painful for months now, worse with eating. Pain is causing headache, nausea. No fevers. No drainange. No uri symptoms. No difficulty swallowing. Has been using a liquid topical numbing medication which has helped some. Took ibuprofen and tylenol yesterday which didn't seem to help. No facial swelling. Hx of anxiety and depression.    ROS per HPI.      Past Medical History:  Diagnosis Date  . Anxiety   . Depression   . Medical history non-contributory     Patient Active Problem List   Diagnosis Date Noted  . Major depressive disorder, recurrent, severe without psychotic features (HCC)   . 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 MDD (major depressive disorder), recurrent episode, severe (HCC) 01/18/2014  . Generalized anxiety disorder 06/30/2013    History reviewed. No pertinent surgical history.  OB History    Gravida  0   Para      Term      Preterm      AB      Living        SAB      TAB      Ectopic      Multiple      Live Births               Home Medications    Prior to Admission medications   Medication Sig Start Date End Date Taking? Authorizing Provider  ibuprofen (ADVIL,MOTRIN) 800 MG tablet Take 1 tablet (800 mg total) by mouth 3 (three) times daily. 05/19/18   Georgetta Haber, NP  penicillin v potassium (VEETID) 500 MG tablet Take 1 tablet (500 mg total) by mouth 4 (four) times daily for 7 days.  05/19/18 05/26/18  Georgetta Haber, NP  traZODone (DESYREL) 50 MG tablet Take 1 tablet (50 mg total) by mouth at bedtime. 10/29/17   Burnard Leigh, MD  venlafaxine XR (EFFEXOR XR) 75 MG 24 hr capsule Take 1 capsule (75 mg total) by mouth daily. 11/07/17 11/07/18  Burnard Leigh, MD    Family History Family History  Problem Relation Age of Onset  . Hypertension Maternal Grandfather   . Depression Mother   . Depression Father     Social History Social History   Tobacco Use  . Smoking status: Current Every Day Smoker    Packs/day: 0.25    Types: Cigarettes  . Smokeless tobacco: Never Used  Substance Use Topics  . Alcohol use: No  . Drug use: Yes    Types: Marijuana     Allergies   Patient has no known allergies.   Review of Systems Review of Systems   Physical Exam Triage Vital Signs ED Triage Vitals  Enc Vitals Group     BP 05/19/18 1635 117/74     Pulse Rate 05/19/18 1635 70     Resp 05/19/18 1635 18  Temp 05/19/18 1635 98.6 F (37 C)     Temp Source 05/19/18 1635 Oral     SpO2 05/19/18 1635 97 %     Weight --      Height --      Head Circumference --      Peak Flow --      Pain Score 05/19/18 1633 6     Pain Loc --      Pain Edu? --      Excl. in GC? --    No data found.  Updated Vital Signs BP 117/74 (BP Location: Right Arm)   Pulse 70   Temp 98.6 F (37 C) (Oral)   Resp 18   SpO2 97%   Visual Acuity Right Eye Distance:   Left Eye Distance:   Bilateral Distance:    Right Eye Near:   Left Eye Near:    Bilateral Near:     Physical Exam  Constitutional: She is oriented to person, place, and time. She appears well-developed and well-nourished. No distress.  HENT:  Mouth/Throat: Uvula is midline, oropharynx is clear and moist and mucous membranes are normal.  Tenderness to gumline at tooth #2 and presence of bilateral upper wisdom teeth; no obvious abscess presence  Cardiovascular: Normal rate, regular rhythm and normal heart  sounds.  Pulmonary/Chest: Effort normal and breath sounds normal.  Neurological: She is alert and oriented to person, place, and time.  Skin: Skin is warm and dry.     UC Treatments / Results  Labs (all labs ordered are listed, but only abnormal results are displayed) Labs Reviewed - No data to display  EKG None  Radiology No results found.  Procedures Procedures (including critical care time)  Medications Ordered in UC Medications - No data to display  Initial Impression / Assessment and Plan / UC Course  I have reviewed the triage vital signs and the nursing notes.  Pertinent labs & imaging results that were available during my care of the patient were reviewed by me and considered in my medical decision making (see chart for details).     Course of pen v provided, ibuprofen for pain. Encouraged follow up with dentist for definitive treatment.  Final Clinical Impressions(s) / UC Diagnoses   Final diagnoses:  Pain, dental     Discharge Instructions     Complete course of antibiotics.  Tylenol and/or ibuprofen as needed for pain or fevers. Ice application to face/jaw may also be helpful.  Gargle after eating to prevent food entrapment to wisdom teeth.  Please follow up with dentist for definitive treatment.    ED Prescriptions    Medication Sig Dispense Auth. Provider   ibuprofen (ADVIL,MOTRIN) 800 MG tablet Take 1 tablet (800 mg total) by mouth 3 (three) times daily. 21 tablet Linus Mako B, NP   penicillin v potassium (VEETID) 500 MG tablet Take 1 tablet (500 mg total) by mouth 4 (four) times daily for 7 days. 28 tablet Georgetta Haber, NP     Controlled Substance Prescriptions  Controlled Substance Registry consulted? Not Applicable   Georgetta Haber, NP 05/19/18 1658

## 2018-06-24 ENCOUNTER — Encounter (HOSPITAL_COMMUNITY): Payer: Self-pay | Admitting: Emergency Medicine

## 2018-06-24 ENCOUNTER — Ambulatory Visit (HOSPITAL_COMMUNITY)
Admission: EM | Admit: 2018-06-24 | Discharge: 2018-06-24 | Disposition: A | Discharge status: Home / Self Care | Payer: Medicaid Other | Attending: Family Medicine | Admitting: Family Medicine

## 2018-06-24 ENCOUNTER — Other Ambulatory Visit: Payer: Self-pay

## 2018-06-24 DIAGNOSIS — K219 Gastro-esophageal reflux disease without esophagitis: Secondary | ICD-10-CM

## 2018-06-24 DIAGNOSIS — R197 Diarrhea, unspecified: Secondary | ICD-10-CM

## 2018-06-24 DIAGNOSIS — R112 Nausea with vomiting, unspecified: Secondary | ICD-10-CM

## 2018-06-24 NOTE — ED Triage Notes (Signed)
PT reports nausea and vomiting that started this AM. Diarrhea as well. PT is feeling better, but will need a note for work. Vomited once this AM

## 2018-06-24 NOTE — Discharge Instructions (Addendum)
°  Nausea, vomiting, and diarrhea: Get rest and drink fluids Declines zofran at this time.  Would like to try diet changes.    DIET Instructions:  Begin with sips of clear liquids. If able to hold down 2 - 4 ounces for 30 minutes, begin drinking more. Increase your fluid intake to replace losses. Clear liquids only for 24 hours (water, tea, sport drinks, clear flat ginger ale or cola and juices, broth, jello, popsicles, ect). Advance to bland foods, applesauce, rice, baked or boiled chicken, ect. Avoid milk, greasy foods and anything that doesnt agree with you.  If you experience new or worsening symptoms return or go to ER such as fever, chills, nausea, vomiting, diarrhea, bloody or dark tarry stools, constipation, urinary symptoms, worsening abdominal discomfort, symptoms that do not improve with medications, inability to keep fluids down, etc...  Acid Reflux instructions:  Avoid eating 2-3 hours before bed Elevate bed.  Avoid chocolate, caffeine, alcohol, onion, and mint prior to bed.  This relaxes the bottom part of your esophagus and can make your symptoms worse.   Follow up with PCP or [facility] if symptoms persists

## 2018-06-24 NOTE — ED Provider Notes (Addendum)
[facility]   [id number] 06/24/18 Arrival Time: 1328  CC: N/V/D  SUBJECTIVE:  Ariana Burns is a 22 y.o. adult who presents with complaint of 1 episode of NB/NB emesis, nausea, and 1 episode of diarrhea that began this morning.  Improved now, requests work note.  Reports eating chik-fil-a last night.  Denies close contacts with similar symptoms, recent travel or antibiotic use.  Denies abdominal pain.  Has not tried OTC medications.  Denies alleviating or aggravating factors.  Reports similar symptoms in the past.  Last BM today with diarrhea.  Complains of decreased appetite  Denies fever, chills, weight changes, chest pain, SOB, constipation, hematochezia, melena, dysuria, difficulty urinating, increased frequency or urgency, flank pain, loss of bowel or bladder function.  Reports hx of acid reflux also.    No LMP recorded.  ROS: As per HPI.  Past Medical History:  Diagnosis Date  . Anxiety   . Depression   . Medical history non-contributory    History reviewed. No pertinent surgical history. No Known Allergies No current facility-administered medications on file prior to encounter.    Current Outpatient Medications on File Prior to Encounter  Medication Sig Dispense Refill  . ibuprofen (ADVIL,MOTRIN) 800 MG tablet Take 1 tablet (800 mg total) by mouth 3 (three) times daily. 21 tablet 0  . traZODone (DESYREL) 50 MG tablet Take 1 tablet (50 mg total) by mouth at bedtime. 90 tablet 3  . venlafaxine XR (EFFEXOR XR) 75 MG 24 hr capsule Take 1 capsule (75 mg total) by mouth daily. 90 capsule 3   Social History   Socioeconomic History  . Marital status: Single    Spouse name: Not on file  . Number of children: Not on file  . Years of education: Not on file  . Highest education level: Not on file  Occupational History  . Not on file  Social Needs  . Financial resource strain: Not on file  . Food insecurity:    Worry: Not on file    Inability: Not on file  .  Transportation needs:    Medical: Not on file    Non-medical: Not on file  Tobacco Use  . Smoking status: Current Every Day Smoker    Packs/day: 0.25    Types: Cigarettes  . Smokeless tobacco: Never Used  Substance and Sexual Activity  . Alcohol use: No  . Drug use: Yes    Types: Marijuana  . Sexual activity: Never  Lifestyle  . Physical activity:    Days per week: Not on file    Minutes per session: Not on file  . Stress: Not on file  Relationships  . Social connections:    Talks on phone: Not on file    Gets together: Not on file    Attends religious service: Not on file    Active member of club or organization: Not on file    Attends meetings of clubs or organizations: Not on file    Relationship status: Not on file  . Intimate partner violence:    Fear of current or ex partner: Not on file    Emotionally abused: Not on file    Physically abused: Not on file    Forced sexual activity: Not on file  Other Topics Concern  . Not on file  Social History Narrative  . Not on file   Family History  Problem Relation Age of Onset  . Hypertension Maternal Grandfather   . Depression Mother   . Depression Father  OBJECTIVE:  Vitals:   06/24/18 1354 06/24/18 1409  BP: 109/73   Pulse: 68   Resp: 16   Temp: 98.7 F (37.1 C)   SpO2: 100%   Weight:  145 lb (65.8 kg)    General appearance: Alert; NAD HEENT: NCAT.  Oropharynx clear.  Lungs: clear to auscultation bilaterally without adventitious breath sounds Heart: regular rate and rhythm.  Radial pulses 2+ symmetrical bilaterally Abdomen: soft, non-distended; normal active bowel sounds; non-tender to light and deep palpation; nontender at McBurney's point; no guarding Back: no CVA tenderness Extremities: no edema; symmetrical with no gross deformities Skin: warm and dry Neurologic: normal gait Psychological: alert and cooperative; normal mood and affect  ASSESSMENT & PLAN:  1. Nausea vomiting and diarrhea     2. Gastroesophageal reflux disease, esophagitis presence not specified     Nausea, vomiting, and diarrhea: Get rest and drink fluids Declines zofran at this time.  Would like to try diet changes.    DIET Instructions:  Begin with sips of clear liquids. If able to hold down 2 - 4 ounces for 30 minutes, begin drinking more. Increase your fluid intake to replace losses. Clear liquids only for 24 hours (water, tea, sport drinks, clear flat ginger ale or cola and juices, broth, jello, popsicles, ect). Advance to bland foods, applesauce, rice, baked or boiled chicken, ect. Avoid milk, greasy foods and anything that doesn't agree with you.  If you experience new or worsening symptoms return or go to ER such as fever, chills, nausea, vomiting, diarrhea, bloody or dark tarry stools, constipation, urinary symptoms, worsening abdominal discomfort, symptoms that do not improve with medications, inability to keep fluids down, etc...  Acid Reflux instructions:  Avoid eating 2-3 hours before bed Elevate bed.  Avoid chocolate, caffeine, alcohol, onion, and mint prior to bed.  This relaxes the bottom part of your esophagus and can make your symptoms worse.   Follow up with PCP or [facility] if symptoms persists  Reviewed expectations re: course of current medical issues. Questions answered. Outlined signs and symptoms indicating need for more acute intervention. Patient verbalized understanding. After Visit Summary given.    Rennis Harding, PA-C 06/24/18 1514

## 2018-08-18 ENCOUNTER — Encounter (HOSPITAL_COMMUNITY): Payer: Self-pay

## 2018-08-18 ENCOUNTER — Emergency Department (HOSPITAL_COMMUNITY)
Admission: EM | Admit: 2018-08-18 | Discharge: 2018-08-18 | Disposition: A | Discharge status: Home / Self Care | Payer: Medicaid Other | Attending: Emergency Medicine | Admitting: Emergency Medicine

## 2018-08-18 ENCOUNTER — Other Ambulatory Visit: Payer: Self-pay

## 2018-08-18 DIAGNOSIS — Z79899 Other long term (current) drug therapy: Secondary | ICD-10-CM | POA: Insufficient documentation

## 2018-08-18 DIAGNOSIS — M549 Dorsalgia, unspecified: Secondary | ICD-10-CM | POA: Insufficient documentation

## 2018-08-18 DIAGNOSIS — Z87891 Personal history of nicotine dependence: Secondary | ICD-10-CM | POA: Insufficient documentation

## 2018-08-18 MED ORDER — MELOXICAM 7.5 MG PO TABS: 7.5000 mg | ORAL_TABLET | Freq: Every day | ORAL | 0 refills | Status: AC

## 2018-08-18 MED ORDER — CYCLOBENZAPRINE HCL 10 MG PO TABS: 10.0000 mg | ORAL_TABLET | Freq: Two times a day (BID) | ORAL | 0 refills | Status: DC | PRN

## 2018-08-18 NOTE — ED Triage Notes (Signed)
Pt states for 1.5-2 weeks, pt has had back pain radiating up their neck. PT states that walking has become painful. Pt has been told in the past they have a "curve" in their spine, but never followed up. Pt also states there is a bump on the right side of their head, on the hairline, behind ear, that has gotten bigger.

## 2018-08-18 NOTE — Discharge Instructions (Addendum)
Apply warm compresses to sore muscles for 30 minutes at a time. Take Meloxicam and Flexeril as needed as prescribed. Gentle stretching to support core strength. Follow up with [facility], referral given.

## 2018-08-18 NOTE — ED Provider Notes (Signed)
[facility]-EMERGENCY DEPT Provider Note   CSN: [id number] Arrival date & time: 08/18/18  1313     History   Chief Complaint Chief Complaint  Patient presents with  . Back Pain    HPI Ariana Burns is a 23 y.o. adult.  23 year old female presents with complaint of back pain, onset approximately 2 years when identified as having a curve in the spine however pain worse for the past week and a half.  Patient reports increase in work hours recently.  Patient works at a [employer], reports lifting to machines and standing for prolonged hours however denies falls or injuries otherwise.  Patient is concerned due to the curve in their spine that there may be something more significant going on at this time.  Pain radiates to legs at times.  Denies abdominal pain, changes in bowel or bladder habits, groin numbness, fevers.  No other complaints or concerns.     Past Medical History:  Diagnosis Date  . Anxiety   . Depression   . Medical history non-contributory     Patient Active Problem List   Diagnosis Date Noted  . Major depressive disorder, recurrent, severe without psychotic features (HCC)   . 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 MDD (major depressive disorder), recurrent episode, severe (HCC) 01/18/2014  . Generalized anxiety disorder 06/30/2013    History reviewed. No pertinent surgical history.   OB History    Gravida  0   Para      Term      Preterm      AB      Living        SAB      TAB      Ectopic      Multiple      Live Births               Home Medications    Prior to Admission medications   Medication Sig Start Date End Date Taking? Authorizing Provider  cyclobenzaprine (FLEXERIL) 10 MG tablet Take 1 tablet (10 mg total) by mouth 2 (two) times daily as needed for muscle spasms. 08/18/18   Jeannie Fend, PA-C  ibuprofen (ADVIL,MOTRIN) 800 MG tablet Take 1 tablet (800 mg total) by mouth 3  (three) times daily. 05/19/18   Georgetta Haber, NP  meloxicam (MOBIC) 7.5 MG tablet Take 1 tablet (7.5 mg total) by mouth daily for 10 days. 08/18/18 08/28/18  Jeannie Fend, PA-C  traZODone (DESYREL) 50 MG tablet Take 1 tablet (50 mg total) by mouth at bedtime. 10/29/17   Burnard Leigh, MD  venlafaxine XR (EFFEXOR XR) 75 MG 24 hr capsule Take 1 capsule (75 mg total) by mouth daily. 11/07/17 11/07/18  Burnard Leigh, MD    Family History Family History  Problem Relation Age of Onset  . Hypertension Maternal Grandfather   . Depression Mother   . Depression Father     Social History Social History   Tobacco Use  . Smoking status: Former Smoker    Packs/day: 0.25    Types: Cigarettes  . Smokeless tobacco: Never Used  Substance Use Topics  . Alcohol use: No  . Drug use: Yes    Types: Marijuana     Allergies   Patient has no known allergies.   Review of Systems Review of Systems  Constitutional: Negative for chills and fever.  Gastrointestinal: Negative for abdominal pain, nausea and vomiting.  Genitourinary: Negative for difficulty urinating.  Musculoskeletal: Positive for back pain. Negative for joint swelling.  Skin: Negative for rash and wound.  Allergic/Immunologic: Negative for immunocompromised state.  Neurological: Negative for weakness and numbness.  Psychiatric/Behavioral: Negative for confusion.  All other systems reviewed and are negative.    Physical Exam Updated Vital Signs BP 119/61 (BP Location: Left Arm)   Pulse 96   Temp 99 F (37.2 C) (Oral)   Resp 16   Ht 5\' 5"  (1.651 m)   Wt 61.2 kg   SpO2 100%   BMI 22.47 kg/m   Physical Exam Vitals signs and nursing note reviewed.  Constitutional:      General: He is not in acute distress.    Appearance: He is well-developed. He is not diaphoretic.  HENT:     Head: Normocephalic and atraumatic.  Pulmonary:     Effort: Pulmonary effort is normal.  Musculoskeletal: Normal range of  motion.        General: Tenderness present. No swelling or signs of injury.     Cervical back: He exhibits tenderness. He exhibits normal range of motion.       Back:     Comments: t-spine minor scoliosis curvature noted.  Skin:    General: Skin is warm and dry.     Findings: No erythema or rash.  Neurological:     Mental Status: He is alert and oriented to person, place, and time.     Gait: Gait normal.  Psychiatric:        Behavior: Behavior normal.      ED Treatments / Results  Labs (all labs ordered are listed, but only abnormal results are displayed) Labs Reviewed - No data to display  EKG None  Radiology No results found.  Procedures Procedures (including critical care time)  Medications Ordered in ED Medications - No data to display   Initial Impression / Assessment and Plan / ED Course  I have reviewed the triage vital signs and the nursing notes.  Pertinent labs & imaging results that were available during my care of the patient were reviewed by me and considered in my medical decision making (see chart for details).  Clinical Course as of Aug 18 1406  Mon Aug 18, 2018  [id number] 23 year old patient sent to the ER for back pain.  No red flag symptoms.  On exam patient has paraspinous muscle tenderness.  Recommend anti-inflammatory muscle relaxer as well as warm compresses, given information on gentle stretching exercises to work on at home for core strength.  Advised patient to contact [facility] to follow-up with PCP care, possible physical therapy referral if deemed necessary.   [LM]    Clinical Course User Index [LM] Jeannie Fend, PA-C   Final Clinical Impressions(s) / ED Diagnoses   Final diagnoses:  Bilateral back pain, unspecified back location, unspecified chronicity    ED Discharge Orders         Ordered    cyclobenzaprine (FLEXERIL) 10 MG tablet  2 times daily PRN     08/18/18 1356    meloxicam (MOBIC) 7.5 MG tablet  Daily      08/18/18 1356           Alden Hipp 08/18/18 1409    Azalia Bilis, MD 08/18/18 1730

## 2018-08-25 ENCOUNTER — Ambulatory Visit (HOSPITAL_COMMUNITY)
Admission: EM | Admit: 2018-08-25 | Discharge: 2018-08-25 | Discharge status: Against Medical Advice | Payer: Medicaid Other

## 2018-08-25 NOTE — ED Notes (Signed)
No answer in lobby.

## 2018-08-25 NOTE — ED Notes (Signed)
No answer in lobby x 3 

## 2018-08-26 ENCOUNTER — Ambulatory Visit (HOSPITAL_COMMUNITY)
Admission: EM | Admit: 2018-08-26 | Discharge: 2018-08-26 | Disposition: A | Discharge status: Home / Self Care | Payer: Medicaid Other | Attending: Family Medicine | Admitting: Family Medicine

## 2018-08-26 ENCOUNTER — Other Ambulatory Visit: Payer: Self-pay

## 2018-08-26 ENCOUNTER — Encounter (HOSPITAL_COMMUNITY): Payer: Self-pay | Admitting: Emergency Medicine

## 2018-08-26 DIAGNOSIS — M62838 Other muscle spasm: Secondary | ICD-10-CM

## 2018-08-26 MED ORDER — IBUPROFEN 800 MG PO TABS: 800.0000 mg | ORAL_TABLET | Freq: Three times a day (TID) | ORAL | 0 refills | Status: DC

## 2018-08-26 MED ORDER — METHOCARBAMOL 750 MG PO TABS: 750.0000 mg | ORAL_TABLET | Freq: Three times a day (TID) | ORAL | 0 refills | Status: DC

## 2018-08-26 NOTE — Discharge Instructions (Signed)
Use anti-inflammatories for pain/swelling. You may take up to 800 mg Ibuprofen every 8 hours with food. You may supplement Ibuprofen with Tylenol [phone] mg every 8 hours.  Please use Robaxin every 8 hours as needed-this is a different muscle relaxer that she may try as an alternative to Flexeril.  Please do not drive or work after taking.  Please limit this use to at home or bedtime. Alternate ice and heat Please establish care with primary care for further evaluation of your symptoms

## 2018-08-26 NOTE — ED Triage Notes (Signed)
Shooting, pulsating pains in random areas of body.  Patient has back pain, but has chronic back issues.

## 2018-08-26 NOTE — ED Notes (Signed)
Bed: UC06 Expected date:  Expected time:  Means of arrival:  Comments: appt 11am

## 2018-08-27 NOTE — ED Provider Notes (Signed)
[facility]    CSN: [id number] Arrival date & time: 08/26/18  1049     History   Chief Complaint Chief Complaint  Patient presents with  . Appointment    HPI Ariana Burns is a 23 y.o. adult transgender female who goes by Ariana Burns.  He is presenting today for evaluation of back pain and spasms.  Patient states that he has noticed over the past few weeks he has had worsening back pain and spasming in various parts of his body.  Initially it was mainly in his back, but he will occasionally feel it in his legs and arms as well.  Spasming lasts a few seconds, but has pain at baseline as well.  Denies any specific injury or increase in activity.  Patient works at [employer] and does occasional slight heavy lifting.  He feels as if this pain is starting to affect his ability to work.  He has been seen previously for similar in the emergency room and treated with Mobic and Flexeril.  He took these sparingly, but did not have any improvement in his symptoms.  HPI  Past Medical History:  Diagnosis Date  . Anxiety   . Depression   . Medical history non-contributory     Patient Active Problem List   Diagnosis Date Noted  . Major depressive disorder, recurrent, severe without psychotic features (HCC)   . 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 MDD (major depressive disorder), recurrent episode, severe (HCC) 01/18/2014  . Generalized anxiety disorder 06/30/2013    History reviewed. No pertinent surgical history.  OB History    Gravida  0   Para      Term      Preterm      AB      Living        SAB      TAB      Ectopic      Multiple      Live Births               Home Medications    Prior to Admission medications   Medication Sig Start Date End Date Taking? Authorizing Provider  traZODone (DESYREL) 50 MG tablet Take 1 tablet (50 mg total) by mouth at bedtime. 10/29/17  Yes Eksir, Bo McclintockAlexander Arya, MD  venlafaxine XR (EFFEXOR XR) 75 MG 24 hr  capsule Take 1 capsule (75 mg total) by mouth daily. 11/07/17 11/07/18 Yes Eksir, Bo McclintockAlexander Arya, MD  cyclobenzaprine (FLEXERIL) 10 MG tablet Take 1 tablet (10 mg total) by mouth 2 (two) times daily as needed for muscle spasms. 08/18/18   Jeannie FendMurphy, Laura A, PA-C  ibuprofen (ADVIL,MOTRIN) 800 MG tablet Take 1 tablet (800 mg total) by mouth 3 (three) times daily. 08/26/18   Wieters, Hallie C, PA-C  meloxicam (MOBIC) 7.5 MG tablet Take 1 tablet (7.5 mg total) by mouth daily for 10 days. 08/18/18 08/28/18  Jeannie FendMurphy, Laura A, PA-C  methocarbamol (ROBAXIN) 750 MG tablet Take 1 tablet (750 mg total) by mouth 3 (three) times daily. 08/26/18   Wieters, Junius CreamerHallie C, PA-C    Family History Family History  Problem Relation Age of Onset  . Hypertension Maternal Grandfather   . Depression Mother   . Depression Father     Social History Social History   Tobacco Use  . Smoking status: Former Smoker    Packs/day: 0.25    Types: Cigarettes  . Smokeless tobacco: Never Used  Substance Use Topics  . Alcohol use: No  .  Drug use: Yes    Types: Marijuana     Allergies   Patient has no known allergies.   Review of Systems Review of Systems  Constitutional: Negative for fatigue and fever.  Eyes: Negative for redness, itching and visual disturbance.  Respiratory: Negative for shortness of breath.   Cardiovascular: Negative for chest pain and leg swelling.  Gastrointestinal: Negative for nausea and vomiting.  Musculoskeletal: Positive for back pain and myalgias. Negative for arthralgias.  Skin: Negative for color change, rash and wound.  Neurological: Negative for dizziness, syncope, weakness, light-headedness and headaches.     Physical Exam Triage Vital Signs ED Triage Vitals  Enc Vitals Group     BP 08/26/18 1116 114/74     Pulse Rate 08/26/18 1116 74     Resp 08/26/18 1116 16     Temp 08/26/18 1116 99.2 F (37.3 C)     Temp Source 08/26/18 1116 Oral     SpO2 08/26/18 1116 100 %     Weight --       Height --      Head Circumference --      Peak Flow --      Pain Score 08/26/18 1131 4     Pain Loc --      Pain Edu? --      Excl. in GC? --    No data found.  Updated Vital Signs BP 114/74 (BP Location: Left Arm)   Pulse 74   Temp 99.2 F (37.3 C) (Oral)   Resp 16   LMP 08/05/2018   SpO2 100%   Visual Acuity Right Eye Distance:   Left Eye Distance:   Bilateral Distance:    Right Eye Near:   Left Eye Near:    Bilateral Near:     Physical Exam Vitals signs and nursing note reviewed.  Constitutional:      Appearance: He is well-developed.  HENT:     Head: Normocephalic and atraumatic.  Eyes:     Conjunctiva/sclera: Conjunctivae normal.  Neck:     Musculoskeletal: Neck supple.  Cardiovascular:     Rate and Rhythm: Normal rate and regular rhythm.     Heart sounds: No murmur.  Pulmonary:     Effort: Pulmonary effort is normal. No respiratory distress.     Breath sounds: Normal breath sounds.  Abdominal:     Palpations: Abdomen is soft.     Tenderness: There is no abdominal tenderness.  Musculoskeletal:     Comments: Nontender to palpation of cervical, thoracic and lumbar spine midline, tenderness to palpation around bilateral paraspinal thoracic musculature and around left scapula  Tenderness throughout left trapezius  Full active range of motion of shoulders, hips and knees, strength 5/5 and equal bilaterally at shoulders, hips and knees.  Patellar reflex 2+ bilaterally.  Skin:    General: Skin is warm and dry.  Neurological:     Mental Status: He is alert.      UC Treatments / Results  Labs (all labs ordered are listed, but only abnormal results are displayed) Labs Reviewed - No data to display  EKG None  Radiology No results found.  Procedures Procedures (including critical care time)  Medications Ordered in UC Medications - No data to display  Initial Impression / Assessment and Plan / UC Course  I have reviewed the triage vital signs and  the nursing notes.  Pertinent labs & imaging results that were available during my care of the patient were reviewed by me and  considered in my medical decision making (see chart for details).     Neurovascularly intact, full active range of motion of joints and strength intact.  Unclear specific cause of spasming in various areas of body, discussed with patient consistent use of anti-inflammatories, will provide Robaxin as alternative muscle relaxer.  Advised may benefit from physical therapy.  Following up with primary care to further evaluate cause of pains.Discussed strict return precautions. Patient verbalized understanding and is agreeable with plan.  Final Clinical Impressions(s) / UC Diagnoses   Final diagnoses:  Trapezius muscle spasm     Discharge Instructions     Use anti-inflammatories for pain/swelling. You may take up to 800 mg Ibuprofen every 8 hours with food. You may supplement Ibuprofen with Tylenol [phone] mg every 8 hours.  Please use Robaxin every 8 hours as needed-this is a different muscle relaxer that she may try as an alternative to Flexeril.  Please do not drive or work after taking.  Please limit this use to at home or bedtime. Alternate ice and heat Please establish care with primary care for further evaluation of your symptoms   ED Prescriptions    Medication Sig Dispense Auth. Provider   ibuprofen (ADVIL,MOTRIN) 800 MG tablet Take 1 tablet (800 mg total) by mouth 3 (three) times daily. 21 tablet Wieters, Hallie C, PA-C   methocarbamol (ROBAXIN) 750 MG tablet Take 1 tablet (750 mg total) by mouth 3 (three) times daily. 30 tablet Wieters, JaytonHallie C, PA-C     Controlled Substance Prescriptions [place] Controlled Substance Registry consulted? Not Applicable   Lew DawesWieters, Hallie C, [place]PA-C 08/27/18 [id number]

## 2018-12-11 ENCOUNTER — Telehealth: Payer: Medicaid Other

## 2018-12-11 ENCOUNTER — Ambulatory Visit (INDEPENDENT_AMBULATORY_CARE_PROVIDER_SITE_OTHER)
Admission: RE | Admit: 2018-12-11 | Discharge: 2018-12-11 | Disposition: A | Discharge status: Home / Self Care | Payer: Self-pay | Source: Ambulatory Visit

## 2018-12-11 DIAGNOSIS — K047 Periapical abscess without sinus: Secondary | ICD-10-CM

## 2018-12-11 MED ORDER — PENICILLIN V POTASSIUM 500 MG PO TABS: 500.0000 mg | ORAL_TABLET | Freq: Four times a day (QID) | ORAL | 0 refills | Status: AC

## 2018-12-11 NOTE — ED Provider Notes (Addendum)
Virtual Visit via Video Note:  Dominick Bise  initiated request for Telemedicine visit with [facility] team. I connected with Neilah Mccrory  on 12/11/2018 at 9:09 AM  for a synchronized telemedicine visit using a video enabled HIPPA compliant telemedicine application. I verified that I am speaking with Lakiesha Colpitts  using two identifiers. Janace Aris, NP  was physically located in a [facility] Urgent care site and Riyanna Radka was located at a different location.   The limitations of evaluation and management by telemedicine as well as the availability of in-person appointments were discussed. Patient was informed that he  may incur a bill ( including co-pay) for this virtual visit encounter. Magaret Kreischer  expressed understanding and gave verbal consent to proceed with virtual visit.     History of Present Illness:Ariana Burns  is a 23 y.o. adult presents with dental pain.  This has been constant over the past couple of days.  The pain is in the left upper.  Previous issues of same and was treated approximately 1 year ago for dental infection.  Has not been able to get into a dentist.  Has been taking ibuprofen for pain with some relief.  Denies any associated fever, chills, body aches, trismus, facial swelling.  Past Medical History:  Diagnosis Date  . Anxiety   . Depression   . Medical history non-contributory     No Known Allergies      Observations/Objective:GENERAL APPEARANCE: Well developed, well nourished, alert and cooperative, and appears to be in no acute distress. HEAD: normocephalic. Non labored breathing, no dyspnea or distress Skin: Skin normal color  PSYCHIATRIC: The mental examination revealed the patient was oriented to person, place, and time. The patient was able to demonstrate good judgement and reason, without hallucinations, abnormal affect or abnormal behaviors during the examination. Patient is not suicidal     Assessment and Plan: treating for dental  infection and having f/u with dentist for further evaluation. ibuprofen for pain as needed.    Follow Up Instructions: Follow up as needed for continued or worsening symptoms     I discussed the assessment and treatment plan with the patient. The patient was provided an opportunity to ask questions and all were answered. The patient agreed with the plan and demonstrated an understanding of the instructions.   The patient was advised to call back or seek an in-person evaluation if the symptoms worsen or if the condition fails to improve as anticipated.    Janace Aris, NP  12/11/2018 9:09 AM         Janace Aris, NP 12/11/18 0315    Janace Aris, NP 12/11/18 1201

## 2018-12-11 NOTE — Discharge Instructions (Signed)
We are treating you for dental infection.  Take the medication as prescribed Ibuprofen for pain Precautions discussed

## 2019-08-23 ENCOUNTER — Ambulatory Visit
Admission: EM | Admit: 2019-08-23 | Discharge: 2019-08-23 | Disposition: A | Discharge status: Home / Self Care | Payer: Medicaid Other | Attending: Emergency Medicine | Admitting: Emergency Medicine

## 2019-08-23 ENCOUNTER — Encounter: Payer: Self-pay | Admitting: Emergency Medicine

## 2019-08-23 ENCOUNTER — Other Ambulatory Visit: Payer: Self-pay

## 2019-08-23 DIAGNOSIS — R059 Cough, unspecified: Secondary | ICD-10-CM

## 2019-08-23 DIAGNOSIS — R05 Cough: Secondary | ICD-10-CM

## 2019-08-23 DIAGNOSIS — Z20822 Contact with and (suspected) exposure to covid-19: Secondary | ICD-10-CM

## 2019-08-23 DIAGNOSIS — J069 Acute upper respiratory infection, unspecified: Secondary | ICD-10-CM

## 2019-08-23 MED ORDER — BENZONATATE 100 MG PO CAPS: 100.0000 mg | ORAL_CAPSULE | Freq: Three times a day (TID) | ORAL | 0 refills | Status: DC

## 2019-08-23 NOTE — ED Triage Notes (Signed)
Pt presents to [facility] for assessment of nasal congestion, sore throat, cough, and chest congestion since Tuesday of this week.

## 2019-08-23 NOTE — Discharge Instructions (Signed)
Your COVID test is pending - it is important to quarantine / isolate at home until your results are back. °If you test positive and would like further evaluation for persistent or worsening symptoms, you may schedule an E-visit or virtual (video) visit throughout the  MyChart app or website. ° °PLEASE NOTE: If you develop severe chest pain or shortness of breath please go to the ER or call 9-1-1 for further evaluation --> DO NOT schedule electronic or virtual visits for this. °Please call our office for further guidance / recommendations as needed. ° °For information about the Covid vaccine, please visit [email or web address] °

## 2019-08-23 NOTE — ED Provider Notes (Signed)
[facility]    CSN: [id number] Arrival date & time: 08/23/19  1216      History   Chief Complaint Chief Complaint  Patient presents with  . APPT: 1230  . URI    HPI Ariana Burns is a 24 y.o. adult presenting for URI symptoms with cough since Tuesday.  Endorsing nasal congestion, sore throat, productive cough with clear to light yellow sputum.  Denies fever, chest pain, shortness of breath, nausea, vomiting, abdominal pain, diarrhea.  T-max 99.44F today.  Has been taking Aleve for symptoms with adequate relief.  No known Covid exposure.  Works in [employer].   Past Medical History:  Diagnosis Date  . Anxiety   . Depression   . Medical history non-contributory     Patient Active Problem List   Diagnosis Date Noted  . Major depressive disorder, recurrent, severe without psychotic features (HCC)   . 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 MDD (major depressive disorder), recurrent episode, severe (HCC) 01/18/2014  . Generalized anxiety disorder 06/30/2013    History reviewed. No pertinent surgical history.  OB History    Gravida  0   Para      Term      Preterm      AB      Living        SAB      TAB      Ectopic      Multiple      Live Births               Home Medications    Prior to Admission medications   Medication Sig Start Date End Date Taking? Authorizing Provider  benzonatate (TESSALON) 100 MG capsule Take 1 capsule (100 mg total) by mouth every 8 (eight) hours. 08/23/19   Hall-Potvin, [place], PA-C  ibuprofen (ADVIL,MOTRIN) 800 MG tablet Take 1 tablet (800 mg total) by mouth 3 (three) times daily. 08/26/18   Wieters, Hallie C, PA-C  methocarbamol (ROBAXIN) 750 MG tablet Take 1 tablet (750 mg total) by mouth 3 (three) times daily. 08/26/18   Wieters, Hallie C, PA-C  traZODone (DESYREL) 50 MG tablet Take 1 tablet (50 mg total) by mouth at bedtime. 10/29/17   Burnard Leigh, MD  venlafaxine XR (EFFEXOR XR) 75  MG 24 hr capsule Take 1 capsule (75 mg total) by mouth daily. 11/07/17 11/07/18  Burnard Leigh, MD    Family History Family History  Problem Relation Age of Onset  . Hypertension Maternal Grandfather   . Depression Mother   . Depression Father     Social History Social History   Tobacco Use  . Smoking status: Former Smoker    Packs/day: 0.25    Types: Cigarettes  . Smokeless tobacco: Never Used  Substance Use Topics  . Alcohol use: No  . Drug use: Yes    Types: Marijuana     Allergies   Patient has no known allergies.   Review of Systems As per HPI   Physical Exam Triage Vital Signs ED Triage Vitals  Enc Vitals Group     BP 08/23/19 1227 122/77     Pulse Rate 08/23/19 1227 81     Resp 08/23/19 1227 16     Temp 08/23/19 1227 98.9 F (37.2 C)     Temp Source 08/23/19 1227 Oral     SpO2 08/23/19 1227 98 %     Weight --      Height --  Head Circumference --      Peak Flow --      Pain Score 08/23/19 1233 0     Pain Loc --      Pain Edu? --      Excl. in [place]? --    No data found.  Updated Vital Signs BP 122/77 (BP Location: Right Arm)   Pulse 81   Temp 98.9 F (37.2 C) (Oral)   Resp 16   LMP 08/23/2019   SpO2 98%   Visual Acuity Right Eye Distance:   Left Eye Distance:   Bilateral Distance:    Right Eye Near:   Left Eye Near:    Bilateral Near:     Physical Exam Constitutional:      General: He is not in acute distress.    Appearance: He is not toxic-appearing or diaphoretic.  HENT:     Head: Normocephalic and atraumatic.     Mouth/Throat:     Mouth: Mucous membranes are moist.     Pharynx: Oropharynx is clear.  Eyes:     General: No scleral icterus.    Conjunctiva/sclera: Conjunctivae normal.     Pupils: Pupils are equal, round, and reactive to light.  Neck:     Comments: Trachea midline, negative JVD Cardiovascular:     Rate and Rhythm: Normal rate and regular rhythm.  Pulmonary:     Effort: Pulmonary effort is normal.  No respiratory distress.     Breath sounds: No wheezing.  Musculoskeletal:     Cervical back: Neck supple. No tenderness.  Lymphadenopathy:     Cervical: No cervical adenopathy.  Skin:    Capillary Refill: Capillary refill takes less than 2 seconds.     Coloration: Skin is not jaundiced or pale.     Findings: No rash.  Neurological:     Mental Status: He is alert and oriented to person, place, and time.      UC Treatments / Results  Labs (all labs ordered are listed, but only abnormal results are displayed) Labs Reviewed  NOVEL CORONAVIRUS, NAA    EKG   Radiology No results found.  Procedures Procedures (including critical care time)  Medications Ordered in UC Medications - No data to display  Initial Impression / Assessment and Plan / UC Course  I have reviewed the triage vital signs and the nursing notes.  Pertinent labs & imaging results that were available during my care of the patient were reviewed by me and considered in my medical decision making (see chart for details).     Patient afebrile, nontoxic, with SpO2 98%.  Covid PCR pending.  Patient to quarantine until results are back.  We will continue supportive management.  Return precautions discussed, patient verbalized understanding and is agreeable to plan. Final Clinical Impressions(s) / UC Diagnoses   Final diagnoses:  Cough  URI with cough and congestion     Discharge Instructions     Your COVID test is pending - it is important to quarantine / isolate at home until your results are back. If you test positive and would like further evaluation for persistent or worsening symptoms, you may schedule an E-visit or virtual (video) visit throughout the [facility] app or website.  PLEASE NOTE: If you develop severe chest pain or shortness of breath please go to the ER or call [phone] for further evaluation --> DO NOT schedule electronic or virtual visits for this. Please call our office for  further guidance / recommendations as needed.  For information about the Covid vaccine, please visit [email or web address]    ED Prescriptions    Medication Sig Dispense Auth. Provider   benzonatate (TESSALON) 100 MG capsule Take 1 capsule (100 mg total) by mouth every 8 (eight) hours. 21 capsule Hall-Potvin, [place], PA-C     PDMP not reviewed this encounter.   Hall-Potvin, [place], [place] 08/23/19 1443

## 2019-08-24 LAB — NOVEL CORONAVIRUS, NAA: SARS-CoV-2, NAA: NOT DETECTED

## 2020-12-23 ENCOUNTER — Emergency Department (HOSPITAL_COMMUNITY): Payer: Medicaid Other

## 2020-12-23 ENCOUNTER — Encounter (HOSPITAL_COMMUNITY): Payer: Self-pay | Admitting: Emergency Medicine

## 2020-12-23 ENCOUNTER — Emergency Department (HOSPITAL_COMMUNITY)
Admission: EM | Admit: 2020-12-23 | Discharge: 2020-12-24 | Disposition: A | Discharge status: Home / Self Care | Payer: Medicaid Other | Attending: Emergency Medicine | Admitting: Emergency Medicine

## 2020-12-23 ENCOUNTER — Other Ambulatory Visit: Payer: Self-pay

## 2020-12-23 ENCOUNTER — Ambulatory Visit
Admission: EM | Admit: 2020-12-23 | Discharge: 2020-12-23 | Disposition: A | Discharge status: Home / Self Care | Payer: Medicaid Other | Attending: Student | Admitting: Student

## 2020-12-23 DIAGNOSIS — R001 Bradycardia, unspecified: Secondary | ICD-10-CM

## 2020-12-23 DIAGNOSIS — Z720 Tobacco use: Secondary | ICD-10-CM

## 2020-12-23 DIAGNOSIS — R0789 Other chest pain: Secondary | ICD-10-CM | POA: Insufficient documentation

## 2020-12-23 DIAGNOSIS — R079 Chest pain, unspecified: Secondary | ICD-10-CM

## 2020-12-23 DIAGNOSIS — F411 Generalized anxiety disorder: Secondary | ICD-10-CM

## 2020-12-23 DIAGNOSIS — R9431 Abnormal electrocardiogram [ECG] [EKG]: Secondary | ICD-10-CM

## 2020-12-23 DIAGNOSIS — R45851 Suicidal ideations: Secondary | ICD-10-CM

## 2020-12-23 DIAGNOSIS — R0602 Shortness of breath: Secondary | ICD-10-CM | POA: Insufficient documentation

## 2020-12-23 DIAGNOSIS — R110 Nausea: Secondary | ICD-10-CM

## 2020-12-23 DIAGNOSIS — F17290 Nicotine dependence, other tobacco product, uncomplicated: Secondary | ICD-10-CM

## 2020-12-23 DIAGNOSIS — F17210 Nicotine dependence, cigarettes, uncomplicated: Secondary | ICD-10-CM

## 2020-12-23 DIAGNOSIS — Z87891 Personal history of nicotine dependence: Secondary | ICD-10-CM | POA: Insufficient documentation

## 2020-12-23 LAB — CBC WITH DIFFERENTIAL/PLATELET
Abs Immature Granulocytes: 0.02 10*3/uL (ref 0.00–0.07)
Basophils Absolute: 0.1 10*3/uL (ref 0.0–0.1)
Basophils Relative: 1 %
Eosinophils Absolute: 0.2 10*3/uL (ref 0.0–0.5)
Eosinophils Relative: 2 %
HCT: 43.3 % (ref 36.0–46.0)
Hemoglobin: 14.4 g/dL (ref 12.0–15.0)
Immature Granulocytes: 0 %
Lymphocytes Relative: 30 %
Lymphs Abs: 3.3 10*3/uL (ref 0.7–4.0)
MCH: 31.3 pg (ref 26.0–34.0)
MCHC: 33.3 g/dL (ref 30.0–36.0)
MCV: 94.1 fL (ref 80.0–100.0)
Monocytes Absolute: 0.6 10*3/uL (ref 0.1–1.0)
Monocytes Relative: 6 %
Neutro Abs: 6.7 10*3/uL (ref 1.7–7.7)
Neutrophils Relative %: 61 %
Platelets: 294 10*3/uL (ref 150–400)
RBC: 4.6 MIL/uL (ref 3.87–5.11)
RDW: 12.4 % (ref 11.5–15.5)
WBC: 10.9 10*3/uL — ABNORMAL HIGH (ref 4.0–10.5)
nRBC: 0 % (ref 0.0–0.2)

## 2020-12-23 LAB — BASIC METABOLIC PANEL
Anion gap: 7 (ref 5–15)
BUN: 6 mg/dL (ref 6–20)
CO2: 26 mmol/L (ref 22–32)
Calcium: 9.2 mg/dL (ref 8.9–10.3)
Chloride: 105 mmol/L (ref 98–111)
Creatinine, Ser: 0.81 mg/dL (ref 0.44–1.00)
GFR, Estimated: >60 mL/min (ref >60)
Glucose, Bld: 112 mg/dL — ABNORMAL HIGH (ref 70–99)
Potassium: 3.2 mmol/L — ABNORMAL LOW (ref 3.5–5.1)
Sodium: 138 mmol/L (ref 135–145)

## 2020-12-23 LAB — TROPONIN I (HIGH SENSITIVITY): Troponin I (High Sensitivity): <2 ng/L (ref <18)

## 2020-12-23 NOTE — ED Triage Notes (Signed)
Pt presents with an onset this morning of chest pain, sob and dizziness. C/o intermittent tingling in bilateral hands. No h/o asthma, but notes several dx of bronchitis in the past. Intermittent chest pain started about 3-39mo ago when Pt started vaping, smoking cigarettes and marijuana. Describes chest pain as "sore".

## 2020-12-23 NOTE — ED Triage Notes (Signed)
Patient reports central chest pain with SOB , dry cough and bradycardia today , seen at urgent care today advised to go here for further evaluation .

## 2020-12-23 NOTE — ED Provider Notes (Addendum)
[facility]    CSN: [id number] Arrival date & time: 12/23/20  1636      History   Chief Complaint Chief Complaint  Patient presents with  . Chest Pain  . Shortness of Breath  . Dizziness    HPI Ariana Burns is a 25 y.o. adult presenting with shortness of breath, chest pain, dizziness, hand numbness, nauseousness x3 months, getting worse.  Medical history anxiety, depression, potassium deficiency requiring hospitalization.  Patient identifies as female. Describes chest pain as sharp and central, without radiation.  Shortness of breath associated with episodes.  Dizziness during episodes.  States these episodes can last for minutes to hours and terminate on their own.  Denies focal weakness.  Nausea without vomiting.  States that her hands were numb for a few minutes in the lobby, but they are numb no longer.  Suicidal ideation but denies plan, denies homicidal ideation.  Previously on medication for depression and anxiety but no longer.  States she has been vaping for 4 years and is concerned that this is contributing.  Is also a current smoker. Denies recent URI.  HPI  Past Medical History:  Diagnosis Date  . Anxiety   . Depression   . Medical history non-contributory     Patient Active Problem List   Diagnosis Date Noted  . Major depressive disorder, recurrent, severe without psychotic features (HCC)   . 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 MDD (major depressive disorder), recurrent episode, severe (HCC) 01/18/2014  . Generalized anxiety disorder 06/30/2013    History reviewed. No pertinent surgical history.  OB History    Gravida  0   Para      Term      Preterm      AB      Living        SAB      IAB      Ectopic      Multiple      Live Births               Home Medications    Prior to Admission medications   Medication Sig Start Date End Date Taking? Authorizing Provider  ibuprofen (ADVIL) 200 MG tablet Take 400  mg by mouth every 6 (six) hours as needed for headache or moderate pain.    [provider]    Family History Family History  Problem Relation Age of Onset  . Hypertension Maternal Grandfather   . Depression Mother   . Depression Father     Social History Social History   Tobacco Use  . Smoking status: Former Smoker    Packs/day: 0.25    Types: Cigarettes  . Smokeless tobacco: Never Used  Vaping Use  . Vaping Use: Every day  Substance Use Topics  . Alcohol use: No  . Drug use: Yes    Types: Marijuana     Allergies   Patient has no known allergies.   Review of Systems Review of Systems  Constitutional: Negative for appetite change, chills and fever.  HENT: Negative for congestion, ear pain, rhinorrhea, sinus pressure, sinus pain and sore throat.   Eyes: Negative for redness and visual disturbance.  Respiratory: Negative for cough, chest tightness, shortness of breath and wheezing.   Cardiovascular: Positive for chest pain. Negative for palpitations.  Gastrointestinal: Negative for abdominal pain, constipation, diarrhea, nausea and vomiting.  Genitourinary: Negative for dysuria, frequency and urgency.  Musculoskeletal: Negative for myalgias.  Neurological: Positive for dizziness and  light-headedness. Negative for tremors, seizures, syncope, speech difficulty, weakness, numbness and headaches.  Psychiatric/Behavioral: Negative for confusion.  All other systems reviewed and are negative.    Physical Exam Triage Vital Signs ED Triage Vitals [12/23/20 1832]  Enc Vitals Group     BP 122/70     Pulse Rate (!) 58     Resp 18     Temp 98.8 F (37.1 C)     Temp Source Oral     SpO2 99 %     Weight      Height      Head Circumference      Peak Flow      Pain Score      Pain Loc      Pain Edu?      Excl. in [place]?    No data found.  Updated Vital Signs BP 122/70 (BP Location: Left Arm)   Pulse 69   Temp 98.8 F (37.1 C) (Oral)   Resp 18   LMP  12/12/2020 (Approximate)   SpO2 99%   Visual Acuity Right Eye Distance:   Left Eye Distance:   Bilateral Distance:    Right Eye Near:   Left Eye Near:    Bilateral Near:     Physical Exam Vitals reviewed.  Constitutional:      General: He is not in acute distress.    Appearance: Normal appearance. He is not ill-appearing or diaphoretic.  HENT:     Head: Normocephalic and atraumatic.     Mouth/Throat:     Mouth: Mucous membranes are moist.  Eyes:     Extraocular Movements: Extraocular movements intact.     Pupils: Pupils are equal, round, and reactive to light.  Cardiovascular:     Rate and Rhythm: Normal rate and regular rhythm.     Pulses:          Radial pulses are 2+ on the right side and 2+ on the left side.     Heart sounds: Normal heart sounds.  Pulmonary:     Effort: Pulmonary effort is normal.     Breath sounds: Normal breath sounds. No wheezing, rhonchi or rales.  Abdominal:     Palpations: Abdomen is soft.     Tenderness: There is no abdominal tenderness. There is no guarding or rebound.  Musculoskeletal:     Cervical back: Normal range of motion and neck supple. No rigidity.     Right lower leg: No edema.     Left lower leg: No edema.  Lymphadenopathy:     Cervical: No cervical adenopathy.  Skin:    General: Skin is warm.     Capillary Refill: Capillary refill takes less than 2 seconds.  Neurological:     General: No focal deficit present.     Mental Status: He is alert and oriented to person, place, and time. Mental status is at baseline.     Cranial Nerves: Cranial nerves are intact. No cranial nerve deficit or facial asymmetry.     Sensory: Sensation is intact. No sensory deficit.     Motor: Motor function is intact. No weakness.     Coordination: Coordination is intact. Coordination normal.     Gait: Gait is intact. Gait normal.     Comments: CN 2-12 intact. No weakness or numbness in UEs or LEs.  Psychiatric:        Mood and Affect: Mood normal.         Behavior: Behavior normal.  Thought Content: Thought content normal.        Judgment: Judgment normal.      UC Treatments / Results  Labs (all labs ordered are listed, but only abnormal results are displayed) Labs Reviewed - No data to display  EKG   Radiology DG Chest 2 View  Result Date: 12/23/2020 CLINICAL DATA:  Chest pain and shortness of breath EXAM: CHEST - 2 VIEW COMPARISON:  None. FINDINGS: The heart size and mediastinal contours are within normal limits. Both lungs are clear. The visualized skeletal structures are unremarkable. IMPRESSION: No active cardiopulmonary disease. Electronically Signed   By: Deatra Robinson M.D.   On: 12/23/2020 21:54    Procedures Procedures (including critical care time)  Medications Ordered in UC Medications - No data to display  Initial Impression / Assessment and Plan / UC Course  I have reviewed the triage vital signs and the nursing notes.  Pertinent labs & imaging results that were available during my care of the patient were reviewed by me and considered in my medical decision making (see chart for details).     This patient is a 25 year old presenting with nonspecific chest pain. Today this pt is afebrile nontachycardic nontachypneic, oxygenating well on room air, no wheezes rhonchi or rales. Fairly benign exam. Current smoker.  History potassium deficiency 10 years ago requirign hospitalization  Passive suicidal ideation but no plan. Denies HI. Denies recent self harm.  History depression and anxiety, previously on medication for this but no longer.   EKG with sinus bradycardia, no prior EKG for comparison.  Head straight to emergency department for further evaluation and management of symptoms.  Hemodynamically stable for transport in personal vehicle, I am not concerned that patient is at risk of self-harm on the way.  6/4 update, patient arrived at ED without issue.  Final Clinical Impressions(s) / UC Diagnoses    Final diagnoses:  Symptomatic bradycardia  Suicidal ideation  Generalized anxiety disorder  Nausea without vomiting  Atypical chest pain  Nonspecific abnormal electrocardiogram (ECG) (EKG)  Vapes nicotine containing substance  Cigarette smoker     Discharge Instructions     -Head straight to the emergency department for further evaluation and management of symptomatic bradycardia. -I am concerned you might have an electrolyte deficiency that is causing your symptoms.  This requires rapid blood work and possibly an infusion requiring hospitalization. -Stop and call 911 if on the way you develop worsening of symptoms like dizziness, shortness of breath, chest pain, weakness.    ED Prescriptions    None     PDMP not reviewed this encounter.   Rhys Martini, PA-C 12/24/20 1013    Rhys Martini, PA-C 12/24/20 1013

## 2020-12-23 NOTE — ED Provider Notes (Signed)
Emergency Medicine Provider Triage Evaluation Note  Ariana Burns , a 25 y.o. adult  was evaluated in triage.  Pt complains of chest pain, shortness of breath for the past 2 weeks.  Initially thought it was due to vaping.  Sent by urgent care for low heart rate.  No leg swelling or cardiac history.  Review of Systems  Positive: Chest pain, shortness of breath Negative: Leg swelling  Physical Exam  BP 114/65 (BP Location: Left Arm)   Pulse 68   Temp 98.4 F (36.9 C) (Oral)   Resp 16   Ht 5\' 5"  (1.651 m)   Wt 55 kg   LMP 12/12/2020 (Approximate)   SpO2 100%   BMI 20.18 kg/m  Gen:   Awake, no distres Resp:  Normal effort  MSK:   Moves extremities without difficulty  Other:  No signs of respiratory distress or leg swelling  Medical Decision Making  Medically screening exam initiated at 9:23 PM.  Appropriate orders placed.  [name] was informed that the remainder of the evaluation will be completed by another provider, this initial triage assessment does not replace that evaluation, and the importance of remaining in the ED until their evaluation is complete.  Labs EKG troponin and chest x-ray ordered   12/14/2020, PA-C 12/23/20 2124    Tegeler, 2125, MD 12/23/20 2358

## 2020-12-23 NOTE — Discharge Instructions (Addendum)
-  Head straight to the emergency department for further evaluation and management of symptomatic bradycardia. -I am concerned you might have an electrolyte deficiency that is causing your symptoms.  This requires rapid blood work and possibly an infusion requiring hospitalization. -Stop and call 911 if on the way you develop worsening of symptoms like dizziness, shortness of breath, chest pain, weakness.

## 2020-12-24 LAB — TROPONIN I (HIGH SENSITIVITY): Troponin I (High Sensitivity): 5 ng/L (ref <18)

## 2020-12-24 NOTE — Discharge Instructions (Signed)
Take 4 over the counter ibuprofen tablets 3 times a day or 2 over-the-counter naproxen tablets twice a day for pain. Also take tylenol 1000mg (2 extra strength) four times a day.    Return for worsening symptoms, coughing up blood, if you feel like you might pass out.

## 2020-12-24 NOTE — ED Provider Notes (Signed)
[facility] EMERGENCY DEPARTMENT Provider Note   CSN: [id number] Arrival date & time: 12/23/20  2105     History Chief Complaint  Patient presents with  . Chest Pain /SOB     Ariana Burns is a 25 y.o. adult.  25 yo F with a chief complaints of chest pain.  This has been off and on today.  Seems to come and go at random.  Nothing seems to make it better or worse.  Denies cough congestion or fever.  Denies trauma.  Describes it as sharp worse with deep breathing sometimes.  Denies history of MI denies family history of early MI.  Denies history of PE or DVT denies hemoptysis denies unilateral lower extremity edema denies recent surgery immobilization or hospitalization denies history of cancer.  Took testosterone supplementation but not for 3 years.    The history is provided by the patient.  Illness Severity:  Moderate Onset quality:  Gradual Duration:  1 day Timing:  Intermittent Progression:  Waxing and waning Chronicity:  New Associated symptoms: chest pain and shortness of breath   Associated symptoms: no abdominal pain, no congestion, no diarrhea, no fever, no headaches, no myalgias, no rash and no vomiting        Past Medical History:  Diagnosis Date  . Anxiety   . Depression   . Medical history non-contributory     Patient Active Problem List   Diagnosis Date Noted  . Major depressive disorder, recurrent, severe without psychotic features (HCC)   . 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 MDD (major depressive disorder), recurrent episode, severe (HCC) 01/18/2014  . Generalized anxiety disorder 06/30/2013    History reviewed. No pertinent surgical history.   OB History    Gravida  0   Para      Term      Preterm      AB      Living        SAB      IAB      Ectopic      Multiple      Live Births              Family History  Problem Relation Age of Onset  . Hypertension Maternal Grandfather   .  Depression Mother   . Depression Father     Social History   Tobacco Use  . Smoking status: Former Smoker    Packs/day: 0.25    Types: Cigarettes  . Smokeless tobacco: Never Used  Vaping Use  . Vaping Use: Every day  Substance Use Topics  . Alcohol use: No  . Drug use: Yes    Types: Marijuana    Home Medications Prior to Admission medications   Medication Sig Start Date End Date Taking? Authorizing Provider  ibuprofen (ADVIL) 200 MG tablet Take 400 mg by mouth every 6 (six) hours as needed for headache or moderate pain.   Yes [provider]    Allergies    Patient has no known allergies.  Review of Systems   Review of Systems  Constitutional: Negative for chills and fever.  HENT: Negative for congestion and facial swelling.   Eyes: Negative for discharge and visual disturbance.  Respiratory: Positive for shortness of breath.   Cardiovascular: Positive for chest pain. Negative for palpitations.  Gastrointestinal: Negative for abdominal pain, diarrhea and vomiting.  Musculoskeletal: Negative for arthralgias and myalgias.  Skin: Negative for color change and rash.  Neurological: Positive for  syncope (near). Negative for tremors and headaches.  Psychiatric/Behavioral: Negative for confusion and dysphoric mood.    Physical Exam Updated Vital Signs BP (!) 118/96   Pulse (!) 57   Temp 98.4 F (36.9 C) (Oral)   Resp 16   Ht 5\' 5"  (1.651 m)   Wt 55 kg   LMP 12/12/2020 (Approximate) Comment: pt shielded  SpO2 100%   BMI 20.18 kg/m   Physical Exam Vitals and nursing note reviewed.  Constitutional:      Appearance: He is well-developed.  HENT:     Head: Normocephalic and atraumatic.  Eyes:     Pupils: Pupils are equal, round, and reactive to light.  Neck:     Vascular: No JVD.  Cardiovascular:     Rate and Rhythm: Normal rate and regular rhythm.     Heart sounds: No murmur heard. No friction rub. No gallop.   Pulmonary:     Effort: No respiratory  distress.     Breath sounds: No wheezing.  Chest:     Comments: Some mild chest discomfort but no obvious reproducible pain. Abdominal:     General: There is no distension.     Tenderness: There is no guarding or rebound.  Musculoskeletal:        General: Normal range of motion.     Cervical back: Normal range of motion and neck supple.  Skin:    Coloration: Skin is not pale.     Findings: No rash.  Neurological:     Mental Status: He is alert and oriented to person, place, and time.  Psychiatric:        Behavior: Behavior normal.     ED Results / Procedures / Treatments   Labs (all labs ordered are listed, but only abnormal results are displayed) Labs Reviewed  BASIC METABOLIC PANEL - Abnormal; Notable for the following components:      Result Value   Potassium 3.2 (*)    Glucose, Bld 112 (*)    All other components within normal limits  CBC WITH DIFFERENTIAL/PLATELET - Abnormal; Notable for the following components:   WBC 10.9 (*)    All other components within normal limits  TROPONIN I (HIGH SENSITIVITY)  TROPONIN I (HIGH SENSITIVITY)    EKG None  Radiology DG Chest 2 View  Result Date: 12/23/2020 CLINICAL DATA:  Chest pain and shortness of breath EXAM: CHEST - 2 VIEW COMPARISON:  None. FINDINGS: The heart size and mediastinal contours are within normal limits. Both lungs are clear. The visualized skeletal structures are unremarkable. IMPRESSION: No active cardiopulmonary disease. Electronically Signed   By: 02/22/2021 M.D.   On: 12/23/2020 21:54    Procedures Procedures   Medications Ordered in ED Medications - No data to display  ED Course  I have reviewed the triage vital signs and the nursing notes.  Pertinent labs & imaging results that were available during my care of the patient were reviewed by me and considered in my medical decision making (see chart for details).    MDM Rules/Calculators/A&P                          25 yo F with a chief  complaints of chest pain.  This is been off and on today.  Went to urgent care and there was some concern for symptomatic bradycardia.  Patient is in a normal sinus rhythm.  Heart rate mostly in the 50s and 60s.  Delta troponin  negative.  EKG without concerning finding.  Will discharge the patient home.  PCP follow-up.  1:56 AM:  I have discussed the diagnosis/risks/treatment options with the patient and family and believe the pt to be eligible for discharge home to follow-up with PCP. We also discussed returning to the ED immediately if new or worsening sx occur. We discussed the sx which are most concerning (e.g., sudden worsening pain, fever, inability to tolerate by mouth) that necessitate immediate return. Medications administered to the patient during their visit and any new prescriptions provided to the patient are listed below.  Medications given during this visit Medications - No data to display   The patient appears reasonably screen and/or stabilized for discharge and I doubt any other medical condition or other [facility] requiring further screening, evaluation, or treatment in the ED at this time prior to discharge.   Final Clinical Impression(s) / ED Diagnoses Final diagnoses:  Nonspecific chest pain    Rx / DC Orders ED Discharge Orders    None       Melene Plan, DO 12/24/20 [phone]

## 2021-04-06 ENCOUNTER — Other Ambulatory Visit: Payer: Self-pay

## 2021-04-06 ENCOUNTER — Ambulatory Visit
Admission: EM | Admit: 2021-04-06 | Discharge: 2021-04-06 | Disposition: A | Discharge status: Home / Self Care | Payer: Medicaid Other | Attending: Internal Medicine | Admitting: Internal Medicine

## 2021-04-06 DIAGNOSIS — R0981 Nasal congestion: Secondary | ICD-10-CM | POA: Insufficient documentation

## 2021-04-06 DIAGNOSIS — Z20822 Contact with and (suspected) exposure to covid-19: Secondary | ICD-10-CM | POA: Insufficient documentation

## 2021-04-06 DIAGNOSIS — J029 Acute pharyngitis, unspecified: Secondary | ICD-10-CM | POA: Insufficient documentation

## 2021-04-06 LAB — POCT RAPID STREP A (OFFICE): Rapid Strep A Screen: NEGATIVE

## 2021-04-06 MED ORDER — AMOXICILLIN 875 MG PO TABS: 875.0000 mg | ORAL_TABLET | Freq: Two times a day (BID) | ORAL | 0 refills | Status: AC

## 2021-04-06 NOTE — ED Provider Notes (Signed)
[facility]    CSN: [id number] Arrival date & time: 04/06/21  7829      History   Chief Complaint Chief Complaint  Patient presents with   Sore Throat    HPI Ariana Burns is a 25 y.o. adult.   Patient presents with nasal congestion, sore throat, fatigue, intermittent headaches, neck pain, nausea that started yesterday.  Denies any chest pain or shortness of breath.  Denies any known fevers or sick contacts.  Sore throat is the most severe out of all the symptoms.  Neck pain feels sore but does not feel stiff.  Denies any vomiting or diarrhea.   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 No pertinent surgical history.  OB History     Gravida  0   Para      Term      Preterm      AB      Living         SAB      IAB      Ectopic      Multiple      Live Births               Home Medications    Prior to Admission medications   Medication Sig Start Date End Date Taking? Authorizing Provider  amoxicillin (AMOXIL) 875 MG tablet Take 1 tablet (875 mg total) by mouth 2 (two) times daily for 10 days. 04/06/21 04/16/21 Yes Lance Muss, FNP  ibuprofen (ADVIL) 200 MG tablet Take 400 mg by mouth every 6 (six) hours as needed for headache or moderate pain.    [provider]    Family History Family History  Problem Relation Age of Onset   Hypertension Maternal Grandfather    Depression Mother    Depression Father     Social History Social History   Tobacco Use   Smoking status: Former    Packs/day: 0.25    Types:  Cigarettes   Smokeless tobacco: Never  Vaping Use   Vaping Use: Every day  Substance Use Topics   Alcohol use: No   Drug use: Yes    Types: Marijuana     Allergies   Patient has no known allergies.   Review of Systems Review of Systems Per HPI  Physical Exam Triage Vital Signs ED Triage Vitals  Enc Vitals Group     BP 04/06/21 1023 104/67     Pulse Rate 04/06/21 1023 67     Resp 04/06/21 1023 18     Temp 04/06/21 1023 98.2 F (36.8 C)     Temp Source 04/06/21 1023 Oral     SpO2 04/06/21 1023 98 %     Weight --      Height --      Head Circumference --      Peak Flow --      Pain Score 04/06/21 1024 6     Pain Loc --      Pain Edu? --      Excl. in GC? --    No  data found.  Updated Vital Signs BP 104/67 (BP Location: Left Arm)   Pulse 67   Temp 98.2 F (36.8 C) (Oral)   Resp 18   SpO2 98%   Visual Acuity Right Eye Distance:   Left Eye Distance:   Bilateral Distance:    Right Eye Near:   Left Eye Near:    Bilateral Near:     Physical Exam Constitutional:      General: He is not in acute distress.    Appearance: Normal appearance.  HENT:     Head: Normocephalic and atraumatic.     Right Ear: Tympanic membrane and ear canal normal.     Left Ear: Tympanic membrane and ear canal normal.     Nose: Congestion present.     Mouth/Throat:     Mouth: Mucous membranes are moist.     Pharynx: Oropharyngeal exudate and posterior oropharyngeal erythema present.     Tonsils: 1+ on the right. 1+ on the left.  Eyes:     Extraocular Movements: Extraocular movements intact.     Conjunctiva/sclera: Conjunctivae normal.     Pupils: Pupils are equal, round, and reactive to light.  Cardiovascular:     Rate and Rhythm: Normal rate and regular rhythm.     Pulses: Normal pulses.     Heart sounds: Normal heart sounds.  Pulmonary:     Effort: Pulmonary effort is normal. No respiratory distress.     Breath sounds: Normal breath sounds. No wheezing.  Abdominal:      General: Abdomen is flat. Bowel sounds are normal.     Palpations: Abdomen is soft.  Musculoskeletal:        General: Normal range of motion.     Cervical back: Normal range of motion.  Skin:    General: Skin is warm and dry.  Neurological:     General: No focal deficit present.     Mental Status: He is alert and oriented to person, place, and time. Mental status is at baseline.  Psychiatric:        Mood and Affect: Mood normal.        Behavior: Behavior normal.     UC Treatments / Results  Labs (all labs ordered are listed, but only abnormal results are displayed) Labs Reviewed  CULTURE, GROUP A STREP ([facility])  NOVEL CORONAVIRUS, NAA  POCT RAPID STREP A (OFFICE)    EKG   Radiology No results found.  Procedures Procedures (including critical care time)  Medications Ordered in UC Medications - No data to display  Initial Impression / Assessment and Plan / UC Course  I have reviewed the triage vital signs and the nursing notes.  Pertinent labs & imaging results that were available during my care of the patient were reviewed by me and considered in my medical decision making (see chart for details).     Rapid strep was negative but highly suspicious of strep throat due to appearance of posterior pharynx and tonsils on exam.  Will treat with amoxicillin x10 days.  Throat culture is pending.  COVID-19 viral swab is pending.  Discussed over-the-counter medication to help alleviate symptoms as well.Discussed strict return precautions. Patient verbalized understanding and is agreeable with plan.  Final Clinical Impressions(s) / UC Diagnoses   Final diagnoses:  Sore throat  Nasal congestion  Encounter for laboratory testing for COVID-19 virus     Discharge Instructions      Rapid strep was negative but highly suspicious of strep throat due to appearance  of your throat on exam.  You have been sent amoxicillin antibiotic to treat this.  Throat culture and COVID-19 swab are  pending.  We will call if these are positive.     ED Prescriptions     Medication Sig Dispense Auth. Provider   amoxicillin (AMOXIL) 875 MG tablet Take 1 tablet (875 mg total) by mouth 2 (two) times daily for 10 days. 20 tablet Lance Muss, FNP      PDMP not reviewed this encounter.   Lance Muss, FNP 04/06/21 1143

## 2021-04-06 NOTE — Discharge Instructions (Signed)
Rapid strep was negative but highly suspicious of strep throat due to appearance of your throat on exam.  You have been sent amoxicillin antibiotic to treat this.  Throat culture and COVID-19 swab are pending.  We will call if these are positive.

## 2021-04-06 NOTE — ED Triage Notes (Signed)
Pt c/o sore throat, headache, neck pain, feeling tired, nasal congestion, nausea onset yesterday. Denies, vomiting, diarrhea, constipation. States tried benadryl at home without relief.

## 2021-04-07 LAB — NOVEL CORONAVIRUS, NAA: SARS-CoV-2, NAA: NOT DETECTED

## 2021-04-07 LAB — SARS-COV-2, NAA 2 DAY TAT

## 2021-04-09 LAB — CULTURE, GROUP A STREP (THRC)

## 2021-05-29 ENCOUNTER — Ambulatory Visit
Admission: RE | Admit: 2021-05-29 | Discharge: 2021-05-29 | Disposition: A | Discharge status: Home / Self Care | Payer: Medicaid Other | Source: Ambulatory Visit | Attending: Physician Assistant | Admitting: Physician Assistant

## 2021-05-29 ENCOUNTER — Other Ambulatory Visit: Payer: Self-pay

## 2021-05-29 VITALS — BP 104/70 | HR 67 | Temp 98.1°F | Ht 65.0 in | Wt 115.0 lb

## 2021-05-29 DIAGNOSIS — J40 Bronchitis, not specified as acute or chronic: Secondary | ICD-10-CM

## 2021-05-29 DIAGNOSIS — J329 Chronic sinusitis, unspecified: Secondary | ICD-10-CM

## 2021-05-29 MED ORDER — ALBUTEROL SULFATE HFA 108 (90 BASE) MCG/ACT IN AERS: 1.0000 | INHALATION_SPRAY | Freq: Four times a day (QID) | RESPIRATORY_TRACT | 0 refills | Status: DC | PRN

## 2021-05-29 MED ORDER — DOXYCYCLINE HYCLATE 100 MG PO CAPS: 100.0000 mg | ORAL_CAPSULE | Freq: Two times a day (BID) | ORAL | 0 refills | Status: DC

## 2021-05-29 MED ORDER — PREDNISONE 20 MG PO TABS: 40.0000 mg | ORAL_TABLET | Freq: Every day | ORAL | 0 refills | Status: AC

## 2021-05-29 NOTE — ED Provider Notes (Signed)
[facility]    CSN: [id number] Arrival date & time: 05/29/21  0947      History   Chief Complaint Chief Complaint  Patient presents with   Appointment   Cough    HPI Ariana Burns is a 25 y.o. adult.   Patient here today for evaluation of nasal congestion, cough and chest congestion she has had for the last 2 weeks.  She has felt like she has had a fever at times but has not measured her temperature.  She denies any vomiting or diarrhea. She has tried  OTC meds without significant relief. She does report some shortness of breath and wheezing at times.   The history is provided by the patient.   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 No pertinent surgical history.  OB History     Gravida  0   Para      Term      Preterm      AB      Living         SAB      IAB      Ectopic      Multiple      Live Births               Home Medications    Prior to Admission medications   Medication Sig Start Date End Date Taking? Authorizing Provider  albuterol (VENTOLIN HFA) 108 (90 Base) MCG/ACT inhaler Inhale 1-2 puffs into the lungs every 6 (six) hours as needed for wheezing or shortness of breath. 05/29/21  Yes Tomi Bamberger, PA-C  doxycycline (VIBRAMYCIN) 100 MG capsule Take 1 capsule (100 mg total) by mouth 2 (two) times daily. 05/29/21  Yes Tomi Bamberger, PA-C  predniSONE (DELTASONE) 20 MG tablet Take 2 tablets (40 mg total) by mouth daily with breakfast for 5 days. 05/29/21 06/03/21 Yes Tomi Bamberger, PA-C  ibuprofen (ADVIL) 200 MG tablet Take 400  mg by mouth every 6 (six) hours as needed for headache or moderate pain.    [provider]    Family History Family History  Problem Relation Age of Onset   Hypertension Maternal Grandfather    Depression Mother    Depression Father     Social History Social History   Tobacco Use   Smoking status: Former    Packs/day: 0.25    Types: Cigarettes   Smokeless tobacco: Never  Vaping Use   Vaping Use: Every day  Substance Use Topics   Alcohol use: No   Drug use: Yes    Types: Marijuana     Allergies   Patient has no known allergies.   Review of Systems Review of Systems  Constitutional:  Negative for chills and fever.  HENT:  Positive for congestion, sinus pressure and sore throat. Negative for ear pain.   Eyes:  Negative for discharge and redness.  Respiratory:  Positive for cough, shortness of breath and wheezing.   Gastrointestinal:  Negative for diarrhea, nausea and vomiting.    Physical Exam Triage Vital Signs ED Triage Vitals  Enc Vitals  Group     BP      Pulse      Resp      Temp      Temp src      SpO2      Weight      Height      Head Circumference      Peak Flow      Pain Score      Pain Loc      Pain Edu?      Excl. in GC?    No data found.  Updated Vital Signs BP 104/70 (BP Location: Left Arm)   Pulse 67   Temp 98.1 F (36.7 C) (Oral)   Ht 5\' 5"  (1.651 m)   Wt 115 lb (52.2 kg)   LMP 04/28/2021   SpO2 98%   BMI 19.14 kg/m     Physical Exam Vitals and nursing note reviewed.  Constitutional:      General: He is not in acute distress.    Appearance: Normal appearance. He is not ill-appearing.  HENT:     Head: Normocephalic and atraumatic.     Right Ear: Tympanic membrane normal.     Left Ear: Tympanic membrane normal.     Nose: Congestion present.     Mouth/Throat:     Mouth: Mucous membranes are moist.     Pharynx: No oropharyngeal exudate or posterior oropharyngeal erythema.  Eyes:     Conjunctiva/sclera:  Conjunctivae normal.  Cardiovascular:     Rate and Rhythm: Normal rate and regular rhythm.     Heart sounds: Normal heart sounds. No murmur heard. Pulmonary:     Effort: Pulmonary effort is normal. No respiratory distress.     Breath sounds: Normal breath sounds. No wheezing, rhonchi or rales.  Skin:    General: Skin is warm and dry.  Neurological:     Mental Status: He is alert.  Psychiatric:        Mood and Affect: Mood normal.        Thought Content: Thought content normal.     UC Treatments / Results  Labs (all labs ordered are listed, but only abnormal results are displayed) Labs Reviewed - No data to display  EKG   Radiology No results found.  Procedures Procedures (including critical care time)  Medications Ordered in UC Medications - No data to display  Initial Impression / Assessment and Plan / UC Course  I have reviewed the triage vital signs and the nursing notes.  Pertinent labs & imaging results that were available during my care of the patient were reviewed by me and considered in my medical decision making (see chart for details).   Will treat with steroid burst and antibiotic therapy. Albuterol inhaler prescribed. Recommend follow up if no gradual improvement or if symptoms worsen.   Final Clinical Impressions(s) / UC Diagnoses   Final diagnoses:  Sinobronchitis   Discharge Instructions   None    ED Prescriptions     Medication Sig Dispense Auth. Provider   doxycycline (VIBRAMYCIN) 100 MG capsule Take 1 capsule (100 mg total) by mouth 2 (two) times daily. 20 capsule 06/28/2021 F, PA-C   predniSONE (DELTASONE) 20 MG tablet Take 2 tablets (40 mg total) by mouth daily with breakfast for 5 days. 10 tablet M F, PA-C   albuterol (VENTOLIN HFA) 108 (90 Base) MCG/ACT inhaler Inhale 1-2 puffs into the lungs every 6 (six) hours as needed for wheezing or shortness of breath. 8  g Tomi Bamberger, PA-C      PDMP not reviewed this  encounter.   Tomi Bamberger, PA-C 05/29/21 1059

## 2021-05-29 NOTE — ED Triage Notes (Signed)
Patient c/o productive cough, congestion, fever, runny nose x 2 weeks.  Patient is vaccinated for COVID.  Patient has been taken Mucinex and Sudafed.

## 2021-07-17 ENCOUNTER — Ambulatory Visit: Admit: 2021-07-17 | Discharge status: Absent without leave | Payer: Medicaid Other

## 2021-11-09 ENCOUNTER — Other Ambulatory Visit: Payer: Self-pay

## 2021-11-09 ENCOUNTER — Emergency Department (HOSPITAL_COMMUNITY)
Admission: EM | Admit: 2021-11-09 | Discharge: 2021-11-10 | Disposition: A | Discharge status: Other Acute Inpatient Hospital | Payer: Medicaid Other | Attending: Emergency Medicine | Admitting: Emergency Medicine

## 2021-11-09 DIAGNOSIS — E876 Hypokalemia: Secondary | ICD-10-CM | POA: Insufficient documentation

## 2021-11-09 DIAGNOSIS — F32A Depression, unspecified: Secondary | ICD-10-CM

## 2021-11-09 DIAGNOSIS — F419 Anxiety disorder, unspecified: Secondary | ICD-10-CM | POA: Insufficient documentation

## 2021-11-09 DIAGNOSIS — Z20822 Contact with and (suspected) exposure to covid-19: Secondary | ICD-10-CM | POA: Insufficient documentation

## 2021-11-09 DIAGNOSIS — R45851 Suicidal ideations: Secondary | ICD-10-CM | POA: Insufficient documentation

## 2021-11-09 DIAGNOSIS — F333 Major depressive disorder, recurrent, severe with psychotic symptoms: Secondary | ICD-10-CM | POA: Insufficient documentation

## 2021-11-09 DIAGNOSIS — R739 Hyperglycemia, unspecified: Secondary | ICD-10-CM | POA: Insufficient documentation

## 2021-11-09 LAB — SALICYLATE LEVEL: Salicylate Lvl: <7 mg/dL — ABNORMAL LOW (ref 7.0–30.0)

## 2021-11-09 LAB — COMPREHENSIVE METABOLIC PANEL
ALT: 17 U/L (ref 0–44)
AST: 23 U/L (ref 15–41)
Albumin: 4.1 g/dL (ref 3.5–5.0)
Alkaline Phosphatase: 54 U/L (ref 38–126)
Anion gap: 9 (ref 5–15)
BUN: 9 mg/dL (ref 6–20)
CO2: 21 mmol/L — ABNORMAL LOW (ref 22–32)
Calcium: 8.8 mg/dL — ABNORMAL LOW (ref 8.9–10.3)
Chloride: 106 mmol/L (ref 98–111)
Creatinine, Ser: 0.8 mg/dL (ref 0.44–1.00)
GFR, Estimated: >60 mL/min (ref >60)
Glucose, Bld: 220 mg/dL — ABNORMAL HIGH (ref 70–99)
Potassium: 3 mmol/L — ABNORMAL LOW (ref 3.5–5.1)
Sodium: 136 mmol/L (ref 135–145)
Total Bilirubin: 1.1 mg/dL (ref 0.3–1.2)
Total Protein: 7.2 g/dL (ref 6.5–8.1)

## 2021-11-09 LAB — CBC
HCT: 42 % (ref 36.0–46.0)
Hemoglobin: 14.6 g/dL (ref 12.0–15.0)
MCH: 32.9 pg (ref 26.0–34.0)
MCHC: 34.8 g/dL (ref 30.0–36.0)
MCV: 94.6 fL (ref 80.0–100.0)
Platelets: 299 10*3/uL (ref 150–400)
RBC: 4.44 MIL/uL (ref 3.87–5.11)
RDW: 12.8 % (ref 11.5–15.5)
WBC: 10.1 10*3/uL (ref 4.0–10.5)
nRBC: 0 % (ref 0.0–0.2)

## 2021-11-09 LAB — RAPID URINE DRUG SCREEN, HOSP PERFORMED
Amphetamines: NOT DETECTED
Barbiturates: NOT DETECTED
Benzodiazepines: NOT DETECTED
Cocaine: POSITIVE — AB
Opiates: NOT DETECTED
Tetrahydrocannabinol: POSITIVE — AB

## 2021-11-09 LAB — I-STAT BETA HCG BLOOD, ED (MC, WL, AP ONLY): I-stat hCG, quantitative: <5 m[IU]/mL (ref <5)

## 2021-11-09 LAB — ETHANOL: Alcohol, Ethyl (B): <10 mg/dL (ref <10)

## 2021-11-09 LAB — ACETAMINOPHEN LEVEL: Acetaminophen (Tylenol), Serum: <10 ug/mL — ABNORMAL LOW (ref 10–30)

## 2021-11-09 MED ORDER — POTASSIUM CHLORIDE CRYS ER 20 MEQ PO TBCR
40.0000 meq | EXTENDED_RELEASE_TABLET | Freq: Once | ORAL | Status: AC
  Administered 2021-11-10: 40 meq via ORAL
  Filled 2021-11-09: qty 2

## 2021-11-09 MED ORDER — HYDROXYZINE HCL 25 MG PO TABS: 25.0000 mg | ORAL_TABLET | Freq: Four times a day (QID) | ORAL | Status: DC | PRN

## 2021-11-09 NOTE — BH Assessment (Signed)
Comprehensive Clinical Assessment (CCA) Note ? ?11/10/2021 ?[name] ?[id number] ? ?DISPOSITION: Gave clinical report to Nira Conn, FNP who determined Pt meets criteria for inpatient psychiatric treatment. AC at [facility] will review for possible admission. Notified Dr. Cathren Laine and Lynett Fish, RN of recommendation. ? ?The patient demonstrates the following risk factors for suicide: Chronic risk factors for suicide include: psychiatric disorder of major depressive disorder and previous self-harm by cutting and burning . Acute risk factors for suicide include: family or marital conflict. Protective factors for this patient include: positive social support and responsibility to others (children, family). Considering these factors, the overall suicide risk at this point appears to be high. Patient is not appropriate for outpatient follow up. ? ?Flowsheet Row ED from 11/09/2021 in [facility] from 05/29/2021 in [facility] at [facility]  ED from 04/06/2021 in [facility] at [facility]   ?C-SSRS RISK CATEGORY High Risk No Risk No Risk  ? ?  ? ?26 year old non-binary Pt (they/them, preferred name "Ariana Burns") who presents unaccompanied to [facility] reporting severe anxiety, depressive symptoms, suicidal ideation, and substance use. Pt has a diagnosis of major depressive disorder and says they have been "going down hill" for several weeks. Pt reports they have been experiencing recurring suicidal ideation and today was seriously contemplating overdosing on pills. Pt felt they were going to act on plan, told their partner, who encouraged Pt to come to [facility]. Pt denies history of attempting suicide, adding they have prepared but not followed through. Pt reports a history of NSSIB including cutting, burning, and hitting oneself. Pt describes mood as depressed and anxious and acknowledges symptoms including crying spells, social withdrawal, loss of  interest in usual pleasures, fatigue, irritability, decreased concentration, decreased sleep, erratic appetite and feelings of guilt, worthlessness and hopelessness. Pt reports sleeping 2-3 hours per night. They report panic attacks that can have physical symptoms including muscle spasms, chills, and vomiting. Pt reports when extremely depressed or anxious experiencing auditory hallucinations of people having conversations and also reports episodes of transient paranoia. Pt denies homicidal ideation or history of aggression. ? ?Pt reports drinking 5-7 shots of alcohol daily after work. They also report daily marijuana use, stating it helps with anxiety. They reports using powder cocaine 1-2 times per week. Pt reports a history of using LSD, last use October 2022. Pt does not identify substance use as a concern. ? ?Pt identifies family conflicts as a primary stressor. They live with their grandmother, whom Pt describes as having "poor boundaries." They say mother disowned them when they were age 26. Pt describes relationship with father as "weird." Pt reports they spend a lot of time with a circle of friends and does feel supported. Pt reports dropping out of school without completing the tenth grade. 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 Pt describes having difficulty with responsibilities including pay bills on-time, managing finances, and addressing responsibilities. Pt denies history of abuse. They deny legal problems. They deny access to firearms. ? ?Pt denies having outpatient mental health providers, citing lack of insurance and limited finances. They say they were prescribed Effexor and Trazodone in the past but found them ineffective. Pt was inpatient on the adolescent unit at [facility] in 2014 and 2015. ? ?Pt is dressed in hospital scrubs, alert and oriented x4. Pt speaks in a clear tone, at moderate volume and normal pace. Motor behavior appears normal. Eye contact is good.  Pt's mood  is depressed and anxious, affect is congruent with mood. Thought process is coherent and relevant. There is no indication from Pt's behavior that they are currently responding to internal stimuli or experiencing delusional thought content. Pt is cooperative and willing to sign voluntarily into a psychiatric facility. ? ? ?Chief Complaint:  ?Chief Complaint  ?Patient presents with  ? Suicidal  ? ?Visit Diagnosis:  ?F33.3 Major depressive disorder, Recurrent episode, With psychotic features ? ? ?CCA Screening, Triage and Referral (STR) ? ?Patient Reported Information ?How did you hear about us? Family/Friend ? ?What Is the Reason for Your Visit/Call Today? Pt has diagnosis of major depressive disorder and has reports recently feeling severely depressed and anxious. Pt reports current suicidal ideation with plan to overdose and cannot contract for safety outside a hospital. Reports use of alcohol, marijuana, and cocaine. ? ?How Long Has This Been Causing You Problems? > than 6 months ? ?What Do You Feel Would Help You the Most Today? Treatment for Depression or other mood problem; Medication(s) ? ? ?Have You Recently Had Any Thoughts About Hurting Yourself? Yes ? ?Are You Planning to Commit Suicide/Harm Yourself At This time? Yes ? ? ?Have you Recently Had Thoughts About Hurting Someone Karolee Ohslse? No ? ?Are You Planning to Harm Someone at This Time? No ? ?Explanation: No data recorded ? ?Have You Used Any Alcohol or Drugs in the Past 24 Hours? Yes ? ?How Long Ago Did You Use Drugs or Alcohol? No data recorded ?What Did You Use and How Much? Pt reports using an unknown amount of cocaine and marijuana ? ? ?Do You Currently Have a Therapist/Psychiatrist? No ? ?Name of Therapist/Psychiatrist: No data recorded ? ?Have You Been Recently Discharged From Any Office Practice or Programs? No ? ?Explanation of Discharge From Practice/Program: No data recorded ? ?  ?CCA Screening Triage Referral Assessment ?Type of Contact:  Tele-Assessment ? ?Telemedicine Service Delivery: Telemedicine service delivery: This service was provided via telemedicine using a 2-way, interactive audio and video technology ? ?Is this Initial or Reassessment? Initial Assessment ? ?Date Telepsych consult ordered in [facility]:  11/09/21 ? ?Time Telepsych consult ordered in [facility]:  2146 ? ?Location of Assessment: [facility] ED ? ?[facility] ? ? ?Collateral Involvement: Medical record ? ? ?Does Patient Have a Automotive engineerCourt Appointed Legal Guardian? No data recorded ?Name and Contact of Legal Guardian: No data recorded ?If Minor and Not Living with Parent(s), Who has Custody? NA ? ?Is CPS involved or ever been involved? Never ? ?Is APS involved or ever been involved? Never ? ? ?Patient Determined To Be At Risk for Harm To Self or Others Based on Review of Patient Reported Information or Presenting Complaint? Yes, for Self-Harm ? ?Method: No data recorded ?Availability of Means: No data recorded ?Intent: No data recorded ?Notification Required: No data recorded ?Additional Information for Danger to Others Potential: No data recorded ?Additional Comments for Danger to Others Potential: No data recorded ?Are There Guns or Other Weapons in Your Home? No data recorded ?Types of Guns/Weapons: No data recorded ?Are These Weapons Safely Secured?                            No data recorded ?Who Could Verify You Are Able To Have These Secured: No data recorded ?Do You Have any Outstanding Charges, Pending Court Dates, Parole/Probation? No data recorded ?Contacted To Inform of Risk of Harm To Self or Others: No  data recorded ? ? ?Does Patient Present under Involuntary Commitment? No ? ?IVC Papers Initial File Date: No data recorded ? ?[place] of Residence: [place] ? ? ?Patient Currently Receiving the Following Services: Not Receiving Services ? ? ?Determination of Need: Urgent (48 hours) ? ? ?Options For Referral: Inpatient Hospitalization; Facility-Based  Crisis ? ? ? ? ?CCA Biopsychosocial ?Patient Reported Schizophrenia/Schizoaffective Diagnosis in Past: No ? ? ?Strengths: Pt is motivated for treatment and has good social support. ? ? ?Mental Health Symptoms ?Depression:   ?C

## 2021-11-09 NOTE — ED Triage Notes (Signed)
Patient coming to ED for evaluation of suicidal thoughts.  Reports "things have been getting worse.  I have been making plans, but have not acted on them."  States "I can't trust myself and want to be somewhere where people can watch me." ?

## 2021-11-09 NOTE — ED Notes (Signed)
Patient calm and cooperative.  No complaints voiced at this time ?

## 2021-11-09 NOTE — ED Provider Notes (Signed)
?[facility]-EM[facility]GENCY DEPT ?Provider Note ? ? ?CSN: [id number] ?Arrival date & time: 11/09/21  2005 ? ?  ? ?History ? ?Chief Complaint  ?Patient presents with  ? Suicidal  ? ? ?Sandford CrazeCierra Vaquerano is a 26 y.o. adult. ? ?Patient c/o suicidal thoughts and depression. Symptoms present in past few weeks, indicates is due to many different factors but does not expound on current issues/problems. Denies attempt to harm self. No plan. Indicates hx depression and was previously admitted as adolescent. No current med use. Denies substance abuse issues. Physical health at baseline. Some decreased appetite and trouble sleeping at night.  ? ?The history is provided by the patient and medical records.  ? ?  ? ?Home Medications ?Prior to Admission medications   ?Medication Sig Start Date End Date Taking? Authorizing Provider  ?albuterol (VENTOLIN HFA) 108 (90 Base) MCG/ACT inhaler Inhale 1-2 puffs into the lungs every 6 (six) hours as needed for wheezing or shortness of breath. 05/29/21   Tomi BambergerMyers, Rebecca F, PA-C  ?doxycycline (VIBRAMYCIN) 100 MG capsule Take 1 capsule (100 mg total) by mouth 2 (two) times daily. 05/29/21   Tomi BambergerMyers, Rebecca F, PA-C  ?ibuprofen (ADVIL) 200 MG tablet Take 400 mg by mouth every 6 (six) hours as needed for headache or moderate pain.    [provider]  ?   ? ?Allergies    ?Patient has no known allergies.   ? ?Review of Systems   ?Review of Systems  ?Constitutional:  Negative for fever.  ?HENT:  Negative for sore throat.   ?Eyes:  Negative for redness.  ?Respiratory:  Negative for cough and shortness of breath.   ?Cardiovascular:  Negative for chest pain.  ?Gastrointestinal:  Negative for abdominal pain and vomiting.  ?Genitourinary:  Negative for flank pain.  ?Musculoskeletal:  Negative for back pain.  ?Skin:  Negative for rash.  ?Neurological:  Negative for headaches.  ?Hematological:  Does not bruise/bleed easily.  ?Psychiatric/Behavioral:  Positive for suicidal ideas. Negative for  confusion. The patient is nervous/anxious.   ? ?Physical Exam ?Updated Vital Signs ?BP 118/64 (BP Location: Left Arm)   Pulse 90   Temp 99.1 ?F (37.3 ?C) (Oral)   Resp 18   SpO2 98%  ?Physical Exam ?Vitals and nursing note reviewed.  ?Constitutional:   ?   Appearance: Normal appearance. He is well-developed.  ?HENT:  ?   Head: Atraumatic.  ?   Nose: Nose normal.  ?   Mouth/Throat:  ?   Mouth: Mucous membranes are moist.  ?Eyes:  ?   General: No scleral icterus. ?   Conjunctiva/sclera: Conjunctivae normal.  ?   Pupils: Pupils are equal, round, and reactive to light.  ?Neck:  ?   Trachea: No tracheal deviation.  ?Cardiovascular:  ?   Rate and Rhythm: Normal rate and regular rhythm.  ?   Pulses: Normal pulses.  ?   Heart sounds: Normal heart sounds. No murmur heard. ?  No friction rub. No gallop.  ?Pulmonary:  ?   Effort: Pulmonary effort is normal. No accessory muscle usage or respiratory distress.  ?   Breath sounds: Normal breath sounds.  ?Abdominal:  ?   General: There is no distension.  ?   Tenderness: There is no abdominal tenderness.  ?Musculoskeletal:     ?   General: No swelling.  ?   Cervical back: Neck supple.  ?Skin: ?   General: Skin is warm and dry.  ?   Findings: No rash.  ?Neurological:  ?  Mental Status: He is alert.  ?   Comments: Alert, speech clear. Steady gait.   ?Psychiatric:  ?   Comments: Depressed mood. +SI.   ? ? ?ED Results / Procedures / Treatments   ?Labs ?(all labs ordered are listed, but only abnormal results are displayed) ?Results for orders placed or performed during the hospital encounter of 11/09/21  ?Comprehensive metabolic panel  ?Result Value Ref Range  ? Sodium 136 135 - 145 mmol/L  ? Potassium 3.0 (L) 3.5 - 5.1 mmol/L  ? Chloride 106 98 - 111 mmol/L  ? CO2 21 (L) 22 - 32 mmol/L  ? Glucose, Bld 220 (H) 70 - 99 mg/dL  ? BUN 9 6 - 20 mg/dL  ? Creatinine, Ser 0.80 0.44 - 1.00 mg/dL  ? Calcium 8.8 (L) 8.9 - 10.3 mg/dL  ? Total Protein 7.2 6.5 - 8.1 g/dL  ? Albumin 4.1 3.5 - 5.0  g/dL  ? AST 23 15 - 41 U/L  ? ALT 17 0 - 44 U/L  ? Alkaline Phosphatase 54 38 - 126 U/L  ? Total Bilirubin 1.1 0.3 - 1.2 mg/dL  ? GFR, Estimated >60 >60 mL/min  ? Anion gap 9 5 - 15  ?Ethanol  ?Result Value Ref Range  ? Alcohol, Ethyl (B) <10 <10 mg/dL  ?Salicylate level  ?Result Value Ref Range  ? Salicylate Lvl <7.0 (L) 7.0 - 30.0 mg/dL  ?Acetaminophen level  ?Result Value Ref Range  ? Acetaminophen (Tylenol), Serum <10 (L) 10 - 30 ug/mL  ?cbc  ?Result Value Ref Range  ? WBC 10.1 4.0 - 10.5 K/uL  ? RBC 4.44 3.87 - 5.11 MIL/uL  ? Hemoglobin 14.6 12.0 - 15.0 g/dL  ? HCT 42.0 36.0 - 46.0 %  ? MCV 94.6 80.0 - 100.0 fL  ? MCH 32.9 26.0 - 34.0 pg  ? MCHC 34.8 30.0 - 36.0 g/dL  ? RDW 12.8 11.5 - 15.5 %  ? Platelets 299 150 - 400 K/uL  ? nRBC 0.0 0.0 - 0.2 %  ?I-Stat beta hCG blood, ED  ?Result Value Ref Range  ? I-stat hCG, quantitative <5.0 <5 mIU/mL  ? Comment 3          ? ? ? ? ?EKG ?None ? ?Radiology ?No results found. ? ?Procedures ?Procedures  ? ? ?Medications Ordered in ED ?Medications - No data to display ? ?ED Course/ Medical Decision Making/ A&P ?  ?                        ?Medical Decision Making ?Problems Addressed: ?Acute depression: acute illness or injury with systemic symptoms that poses a threat to life or bodily functions ?Anxiety: acute illness or injury ?Hyperglycemia: acute illness or injury ?Hypokalemia: acute illness or injury ?Suicidal ideation: acute illness or injury with systemic symptoms that poses a threat to life or bodily functions ? ?Amount and/or Complexity of Data Reviewed ?External Data Reviewed: notes. ?Labs: ordered. Decision-making details documented in ED Course. ? ?Risk ?OTC drugs. ?Prescription drug management. ?Decision regarding hospitalization. ? ? ?Labs sent.  ? ?Reviewed nursing notes and prior charts for additional history. Disposition decision, including potential need for admission considered - will get labs and behavioral health consult - disposition per [facility] team.  ? ?Labs  reviewed/interpreted by me - k mildly low. Kcl po.  ? ?TTS/BH team consulted.  ? ?The patient has been placed in psychiatric observation due to the need to provide a safe environment for the patient  while obtaining psychiatric consultation and evaluation, as well as ongoing medical and medication management to treat the patient's condition.  The patient has not been placed under full IVC at this time. ? ?Disposition per [facility] team.  ? ?Vistaril po for anxiety. ? ? ? ? ? ? ? ? ? ?Final Clinical Impression(s) / ED Diagnoses ?Final diagnoses:  ?None  ? ? ?Rx / DC Orders ?ED Discharge Orders   ? ? None  ? ?  ? ? ?  ?Cathren Laine, MD ?11/09/21 2149 ? ?
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
## 2021-11-09 NOTE — ED Notes (Signed)
Patient moved to room for TTS.  

## 2021-11-10 ENCOUNTER — Inpatient Hospital Stay (HOSPITAL_COMMUNITY)
Admission: AD | Admit: 2021-11-10 | Discharge: 2021-11-15 | DRG: 885 | Disposition: A | Discharge status: Home / Self Care | Payer: Federal, State, Local not specified - Other | Source: Intra-hospital | Attending: Psychiatry | Admitting: Psychiatry

## 2021-11-10 ENCOUNTER — Other Ambulatory Visit: Payer: Self-pay | Admitting: Psychiatry

## 2021-11-10 ENCOUNTER — Encounter (HOSPITAL_COMMUNITY): Payer: Self-pay | Admitting: Psychiatry

## 2021-11-10 DIAGNOSIS — F332 Major depressive disorder, recurrent severe without psychotic features: Principal | ICD-10-CM | POA: Diagnosis present

## 2021-11-10 DIAGNOSIS — F1220 Cannabis dependence, uncomplicated: Secondary | ICD-10-CM | POA: Diagnosis present

## 2021-11-10 DIAGNOSIS — Z91410 Personal history of adult physical and sexual abuse: Secondary | ICD-10-CM

## 2021-11-10 DIAGNOSIS — Z818 Family history of other mental and behavioral disorders: Secondary | ICD-10-CM

## 2021-11-10 DIAGNOSIS — G4700 Insomnia, unspecified: Secondary | ICD-10-CM | POA: Diagnosis present

## 2021-11-10 DIAGNOSIS — F5081 Binge eating disorder: Secondary | ICD-10-CM | POA: Diagnosis present

## 2021-11-10 DIAGNOSIS — F4010 Social phobia, unspecified: Secondary | ICD-10-CM | POA: Diagnosis present

## 2021-11-10 DIAGNOSIS — E876 Hypokalemia: Secondary | ICD-10-CM | POA: Diagnosis not present

## 2021-11-10 DIAGNOSIS — F333 Major depressive disorder, recurrent, severe with psychotic symptoms: Principal | ICD-10-CM | POA: Diagnosis present

## 2021-11-10 DIAGNOSIS — F603 Borderline personality disorder: Secondary | ICD-10-CM

## 2021-11-10 DIAGNOSIS — F17290 Nicotine dependence, other tobacco product, uncomplicated: Secondary | ICD-10-CM | POA: Diagnosis present

## 2021-11-10 DIAGNOSIS — R45851 Suicidal ideations: Secondary | ICD-10-CM | POA: Diagnosis present

## 2021-11-10 DIAGNOSIS — F1320 Sedative, hypnotic or anxiolytic dependence, uncomplicated: Secondary | ICD-10-CM | POA: Diagnosis present

## 2021-11-10 DIAGNOSIS — Z9152 Personal history of nonsuicidal self-harm: Secondary | ICD-10-CM

## 2021-11-10 DIAGNOSIS — F10139 Alcohol abuse with withdrawal, unspecified: Secondary | ICD-10-CM | POA: Diagnosis not present

## 2021-11-10 DIAGNOSIS — F4310 Post-traumatic stress disorder, unspecified: Secondary | ICD-10-CM | POA: Diagnosis present

## 2021-11-10 DIAGNOSIS — F1310 Sedative, hypnotic or anxiolytic abuse, uncomplicated: Secondary | ICD-10-CM | POA: Diagnosis present

## 2021-11-10 DIAGNOSIS — F1090 Alcohol use, unspecified, uncomplicated: Secondary | ICD-10-CM | POA: Diagnosis present

## 2021-11-10 DIAGNOSIS — R4587 Impulsiveness: Secondary | ICD-10-CM | POA: Diagnosis present

## 2021-11-10 LAB — LIPID PANEL
Cholesterol: 138 mg/dL (ref 0–200)
HDL: 72 mg/dL (ref >40)
LDL Cholesterol: 55 mg/dL (ref 0–99)
Total CHOL/HDL Ratio: 1.9 RATIO
Triglycerides: 55 mg/dL (ref <150)
VLDL: 11 mg/dL (ref 0–40)

## 2021-11-10 LAB — HEPATITIS PANEL, ACUTE
HCV Ab: NONREACTIVE
Hep A IgM: NONREACTIVE
Hep B C IgM: NONREACTIVE
Hepatitis B Surface Ag: NONREACTIVE

## 2021-11-10 LAB — BASIC METABOLIC PANEL
Anion gap: 8 (ref 5–15)
BUN: 12 mg/dL (ref 6–20)
CO2: 21 mmol/L — ABNORMAL LOW (ref 22–32)
Calcium: 9.1 mg/dL (ref 8.9–10.3)
Chloride: 107 mmol/L (ref 98–111)
Creatinine, Ser: 0.8 mg/dL (ref 0.44–1.00)
GFR, Estimated: >60 mL/min (ref >60)
Glucose, Bld: 214 mg/dL — ABNORMAL HIGH (ref 70–99)
Potassium: 3.5 mmol/L (ref 3.5–5.1)
Sodium: 136 mmol/L (ref 135–145)

## 2021-11-10 LAB — RESP PANEL BY RT-PCR (FLU A&B, COVID) ARPGX2
Influenza A by PCR: NEGATIVE
Influenza B by PCR: NEGATIVE
SARS Coronavirus 2 by RT PCR: NEGATIVE

## 2021-11-10 LAB — LIPASE, BLOOD: Lipase: 41 U/L (ref 11–51)

## 2021-11-10 LAB — HEMOGLOBIN A1C
Hgb A1c MFr Bld: 4.9 % (ref 4.8–5.6)
Mean Plasma Glucose: 93.93 mg/dL

## 2021-11-10 LAB — HIV ANTIBODY (ROUTINE TESTING W REFLEX): HIV Screen 4th Generation wRfx: NONREACTIVE

## 2021-11-10 MED ORDER — HYDROXYZINE HCL 25 MG PO TABS: 25.0000 mg | ORAL_TABLET | Freq: Four times a day (QID) | ORAL | Status: AC | PRN

## 2021-11-10 MED ORDER — LOPERAMIDE HCL 2 MG PO CAPS: 2.0000 mg | ORAL_CAPSULE | ORAL | Status: AC | PRN

## 2021-11-10 MED ORDER — LORAZEPAM 1 MG PO TABS
1.0000 mg | ORAL_TABLET | Freq: Four times a day (QID) | ORAL | Status: AC
  Administered 2021-11-10 – 2021-11-11 (×6): 1 mg via ORAL
  Filled 2021-11-10 (×5): qty 1

## 2021-11-10 MED ORDER — HYDROXYZINE HCL 25 MG PO TABS
25.0000 mg | ORAL_TABLET | Freq: Three times a day (TID) | ORAL | Status: DC | PRN
  Administered 2021-11-12 – 2021-11-14 (×3): 25 mg via ORAL
  Filled 2021-11-10 (×2): qty 1
  Filled 2021-11-10: qty 10
  Filled 2021-11-10 (×2): qty 1

## 2021-11-10 MED ORDER — NICOTINE POLACRILEX 2 MG MT GUM: 2.0000 mg | CHEWING_GUM | OROMUCOSAL | Status: DC | PRN

## 2021-11-10 MED ORDER — THIAMINE HCL 100 MG PO TABS
100.0000 mg | ORAL_TABLET | Freq: Every day | ORAL | Status: DC
  Administered 2021-11-11 – 2021-11-15 (×5): 100 mg via ORAL
  Filled 2021-11-10 (×6): qty 1
  Filled 2021-11-10: qty 14
  Filled 2021-11-10: qty 1

## 2021-11-10 MED ORDER — ADULT MULTIVITAMIN W/MINERALS CH
1.0000 | ORAL_TABLET | Freq: Every day | ORAL | Status: DC
  Administered 2021-11-10 – 2021-11-15 (×6): 1 via ORAL
  Filled 2021-11-10 (×2): qty 1
  Filled 2021-11-10: qty 7
  Filled 2021-11-10 (×8): qty 1

## 2021-11-10 MED ORDER — FLUOXETINE HCL 10 MG PO CAPS
10.0000 mg | ORAL_CAPSULE | Freq: Every day | ORAL | Status: DC
  Administered 2021-11-10 – 2021-11-13 (×4): 10 mg via ORAL
  Filled 2021-11-10 (×8): qty 1

## 2021-11-10 MED ORDER — LORAZEPAM 1 MG PO TABS
1.0000 mg | ORAL_TABLET | Freq: Four times a day (QID) | ORAL | Status: AC | PRN
  Filled 2021-11-10: qty 1

## 2021-11-10 MED ORDER — ARIPIPRAZOLE 5 MG PO TABS
5.0000 mg | ORAL_TABLET | Freq: Every day | ORAL | Status: DC
  Administered 2021-11-10 – 2021-11-14 (×5): 5 mg via ORAL
  Filled 2021-11-10 (×7): qty 1
  Filled 2021-11-10: qty 7
  Filled 2021-11-10 (×3): qty 1

## 2021-11-10 MED ORDER — BACITRACIN-NEOMYCIN-POLYMYXIN OINTMENT TUBE
TOPICAL_OINTMENT | Freq: Every day | CUTANEOUS | Status: DC
  Administered 2021-11-10 – 2021-11-11 (×2): 1 via TOPICAL
  Filled 2021-11-10 (×2): qty 14.17

## 2021-11-10 MED ORDER — ONDANSETRON 4 MG PO TBDP
4.0000 mg | ORAL_TABLET | Freq: Four times a day (QID) | ORAL | Status: AC | PRN
  Administered 2021-11-12 – 2021-11-13 (×2): 4 mg via ORAL
  Filled 2021-11-10 (×3): qty 1

## 2021-11-10 MED ORDER — LORAZEPAM 1 MG PO TABS
1.0000 mg | ORAL_TABLET | Freq: Two times a day (BID) | ORAL | Status: AC
  Administered 2021-11-13 (×2): 1 mg via ORAL
  Filled 2021-11-10 (×2): qty 1

## 2021-11-10 MED ORDER — LORAZEPAM 1 MG PO TABS
1.0000 mg | ORAL_TABLET | Freq: Every day | ORAL | Status: AC
  Administered 2021-11-14: 1 mg via ORAL
  Filled 2021-11-10: qty 1

## 2021-11-10 MED ORDER — GABAPENTIN 100 MG PO CAPS
100.0000 mg | ORAL_CAPSULE | Freq: Three times a day (TID) | ORAL | Status: DC
  Administered 2021-11-10 – 2021-11-15 (×15): 100 mg via ORAL
  Filled 2021-11-10 (×2): qty 21
  Filled 2021-11-10 (×14): qty 1
  Filled 2021-11-10: qty 21
  Filled 2021-11-10 (×7): qty 1

## 2021-11-10 MED ORDER — LORAZEPAM 1 MG PO TABS
1.0000 mg | ORAL_TABLET | Freq: Three times a day (TID) | ORAL | Status: AC
  Administered 2021-11-12 (×3): 1 mg via ORAL
  Filled 2021-11-10 (×3): qty 1

## 2021-11-10 MED ORDER — THIAMINE HCL 100 MG/ML IJ SOLN
100.0000 mg | Freq: Once | INTRAMUSCULAR | Status: AC
  Administered 2021-11-10: 100 mg via INTRAMUSCULAR
  Filled 2021-11-10: qty 2

## 2021-11-10 NOTE — BHH Group Notes (Signed)
Pt attended AA meeting.  

## 2021-11-10 NOTE — ED Notes (Signed)
Report to [facility], RN at [facility], safe transport called to transport pt to [facility]. ?
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
## 2021-11-10 NOTE — Tx Team (Signed)
Initial Treatment Plan ?11/10/2021 ?4:14 PM ?[name] ?[id number] ? ? ? ?PATIENT STRESSORS: ?Financial difficulties   ?Marital or family conflict   ?Medication change or noncompliance   ?Substance abuse   ?Traumatic event   ? ? ?PATIENT STRENGTHS: ?Capable of independent living  ?Communication skills  ?Financial means  ?Physical Health  ?Supportive family/friends  ?Work skills  ? ? ?PATIENT IDENTIFIED PROBLEMS: ?Risk for self harm "I've been suicidal and I can't shake it off"  ?  ?Alterations in mood "I've been depressed, sad and just anxious more lately".  ?  ?Substance abuse "I vape delta 8, marijuana, I used cocaine 2 days ago & drinks 5-6 shots about everyday"  ?  ?Financial constraints  ?  ?  ?  ? ?DISCHARGE CRITERIA:  ?Improved stabilization in mood, thinking, and/or behavior ?Verbal commitment to aftercare and medication compliance ? ?PRELIMINARY DISCHARGE PLAN: ?Outpatient therapy ?Return to previous work or school arrangements ? ?PATIENT/FAMILY INVOLVEMENT: ?This treatment plan has been presented to and reviewed with the patient, Ariana Burns. The patient have been given the opportunity to ask questions and make suggestions. ? ?Keane Police, RN ?11/10/2021, 4:14 PM ?
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
## 2021-11-10 NOTE — Group Note (Signed)
LCSW Group Therapy Note ? ? ?Group Date: 11/10/2021 ?Start Time: 1300 ?End Time: 1400 ? ?Type of Therapy and Topic: Group Therapy: Effective Communication ?  ?Participation Level: Active  ?  ?Description of Group:  ?In this group patients will be asked to identify their own styles of communication as well as defining and identifying passive, assertive, and aggressive styles of communication. Participants will identify strategies to communicate in a more assertive manner in an effort to appropriately meet their needs. This group will be process-oriented, with patients participating in exploration of their own experiences as well as giving and receiving support and challenge from other group members. ?  ?Therapeutic Goals: ?1. Patient will identify their personal communication style. ?2. Patient will identify passive, assertive, and aggressive forms of communication. ?3. Patient will identify strategies for developing more effective communication to appropriately meet their needs.  ?  ?Summary of Patient Progress The Pt attended group and remained there the entire time.  The Pt accepted the worksheet and followed along throughout the group session.  The Pt demonstrated knowledge of the topic being discussed and was appropriate with their peers.  The Pt was able to identify ways they can improve their communications skills and be more assertive when communicating their needs.  ? ?Aram Beecham, LCSWA ?11/10/2021  1:44 PM   ? ?

## 2021-11-10 NOTE — Progress Notes (Signed)
Patient ID: Ariana Burns, adult   DOB: May 24, 1996, 24 y.o.   MRN: [id number] ?Patient has been accepted to [facility]. Please call report to [phone]. Rayfield Citizen, RN/AC ?
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
## 2021-11-10 NOTE — Progress Notes (Signed)
?   11/10/21 2148  ?Psych Admission Type (Psych Patients Only)  ?Admission Status Voluntary  ?Psychosocial Assessment  ?Patient Complaints Anxiety;Depression  ?Eye Contact Fair  ?Facial Expression Anxious;Animated  ?Affect Appropriate to circumstance  ?Speech Logical/coherent  ?Interaction Assertive  ?Motor Activity Other (Comment) ?(WDL)  ?Appearance/Hygiene Unremarkable  ?Behavior Characteristics Cooperative;Appropriate to situation  ?Mood Pleasant  ?Thought Process  ?Coherency WDL  ?Content WDL  ?Delusions None reported or observed  ?Perception WDL  ?Hallucination None reported or observed  ?Judgment WDL  ?Confusion None  ?Danger to Self  ?Current suicidal ideation? Passive  ?Self-Injurious Behavior No self-injurious ideation or behavior indicators observed or expressed   ?Agreement Not to Harm Self Yes  ?Description of Agreement Verbal contract  ?Danger to Others  ?Danger to Others None reported or observed  ? ? ?

## 2021-11-11 LAB — TSH: TSH: 0.576 u[IU]/mL (ref 0.350–4.500)

## 2021-11-11 LAB — RPR: RPR Ser Ql: NONREACTIVE

## 2021-11-11 LAB — VITAMIN B12: Vitamin B-12: 282 pg/mL (ref 180–914)

## 2021-11-11 MED ORDER — IBUPROFEN 400 MG PO TABS
400.0000 mg | ORAL_TABLET | Freq: Four times a day (QID) | ORAL | Status: DC | PRN
  Administered 2021-11-12: 400 mg via ORAL
  Filled 2021-11-11: qty 1

## 2021-11-11 NOTE — Progress Notes (Signed)
[facility] ? ?11/11/2021 3:33 PM ?Ariana Burns  ?MRN:  [id number] ? ?Subjective:  "I have a black and white, love and hate thinking process all at the same time. Sometimes I can feel my emotions and it hurts." ? ?Brief History:  Ariana Burns is a 26 yo patient who uses they/ them pronouns, with a PPH of MDD and anxiety [facility] after endorsing SI with a plan. ? ?Daily Notes 11/11/21: On assessment today, patient is examined in the office sitting calmly on a chair. Chart reviewed and findings shared with the treatment team and discussed with Dr. Loleta ChanceHill. Alert and oriented to name, time, place and situation. Good eye contact during encounter. Speech is fluent and with normal pattern and volume. Affect is constricted and mood is anxious, depressed and irritable. Memory, judgment and insight is fair. Thought process is coherent but linear (Had difficulty remembering thing sometimes). Added, "I have some memory issues and cannot remember most things due to taking a lot of non-prescribed xanax. ? ?Denied SI/HI/AH, but endorsed AV stating, "I see shadows move around sometimes. Endorsed 5 hours of sleep last night. Rated depression as "4" and anxiety as "5" on a scale of 0 to 10.  Appetite is fair, consuming 60% of meals. Pending Labs Reviewed on 11/11/21 include: TSH - 0.576,  A1C - 4.9, Lipid Profile - Normal, Lipase - 41, B12 - 282, Hepatitis Panel - Normal, Glucose 214, HIV Test - Non reactive, Hepatitis A/B/C - Non reactive, GC/Chlamydia/ RPR - Non Reactive. Instructions provided on polysubstance use cessation due to positive THC and cocaine in urine and alcohol use dependence. Continues on Detox protocol. ?  ?Principal Problem: MDD (major depressive disorder), recurrent episode, severe (HCC) ? ?Diagnosis: Principal Problem: ?  MDD (major depressive disorder), recurrent episode, severe (HCC) ?Active Problems: ?  Social anxiety disorder ?  PTSD (post-traumatic stress disorder) ?   Alcohol use disorder ?  Sedative, hypnotic or anxiolytic use disorder, mild, abuse (HCC) ?  Cannabis use disorder, severe, dependence (HCC) ? ?Total Time spent with patient: 30 minutes ? ?Past Psychiatric History: [facility] and 06/2014 dx with MDD ? ?Past Medical History:  ?Past Medical History:  ?Diagnosis Date  ? Anxiety   ? Depression   ? Medical history non-contributory   ? History reviewed. No pertinent surgical history. ?Family History:  ?Family History  ?Problem Relation Age of Onset  ? Hypertension Maternal Grandfather   ? Depression Mother   ? Depression Father   ? ?Family Psychiatric  History: Mom: [facility] and has a SA ? ?Social History:  ?Social History  ? ?Substance and Sexual Activity  ?Alcohol Use Yes  ? Alcohol/week: 6.0 standard drinks  ? Types: 6 Shots of liquor per week  ? Comment: "I take 5-6 shots about 5-7 days / week"  ?   ?Social History  ? ?Substance and Sexual Activity  ?Drug Use Yes  ? Types: Marijuana  ?  ?Social History  ? ?Socioeconomic History  ? Marital status: Single  ?  Spouse name: Not on file  ? Number of children: Not on file  ? Years of education: Not on file  ? Highest education level: Not on file  ?Occupational History  ? Not on file  ?Tobacco Use  ? Smoking status: Former  ?  Packs/day: 0.25  ?  Types: Cigarettes  ? Smokeless tobacco: Never  ?Vaping Use  ? Vaping  Use: Every day  ? Devices: "I vape nicotine, delta 8 and marijuana"  ?Substance and Sexual Activity  ? Alcohol use: Yes  ?  Alcohol/week: 6.0 standard drinks  ?  Types: 6 Shots of liquor per week  ?  Comment: "I take 5-6 shots about 5-7 days / week"  ? Drug use: Yes  ?  Types: Marijuana  ? Sexual activity: Yes  ?  Birth control/protection: None  ?Other Topics Concern  ? Not on file  ?Social History Narrative  ? Single and currently lives with paternal grandparents.  ? ?Social Determinants of Health  ? ?Financial Resource Strain: Not on file  ?Food Insecurity:  Not on file  ?Transportation Needs: Not on file  ?Physical Activity: Not on file  ?Stress: Not on file  ?Social Connections: Not on file  ? ?Additional Social History:  ?  ?Sleep: Good ? ?Appetite:  Good ? ?Current Medications: ?Current Facility-Administered Medications  ?Medication Dose Route Frequency Provider Last Rate Last Admin  ? ARIPiprazole (ABILIFY) tablet 5 mg  5 mg Oral Q2000 Eliseo Gum B, MD   5 mg at 11/10/21 2148  ? FLUoxetine (PROZAC) capsule 10 mg  10 mg Oral Daily Eliseo Gum B, MD   10 mg at 11/11/21 0759  ? gabapentin (NEURONTIN) capsule 100 mg  100 mg Oral TID Eliseo Gum B, MD   100 mg at 11/11/21 1307  ? hydrOXYzine (ATARAX) tablet 25 mg  25 mg Oral TID PRN Phineas Inches, MD      ? hydrOXYzine (ATARAX) tablet 25 mg  25 mg Oral Q6H PRN Eliseo Gum B, MD      ? ibuprofen (ADVIL) tablet 400 mg  400 mg Oral Q6H PRN Hill, Shelbie Hutching, MD      ? loperamide (IMODIUM) capsule 2-4 mg  2-4 mg Oral PRN Eliseo Gum B, MD      ? LORazepam (ATIVAN) tablet 1 mg  1 mg Oral Q6H PRN Eliseo Gum B, MD      ? LORazepam (ATIVAN) tablet 1 mg  1 mg Oral QID Eliseo Gum B, MD   1 mg at 11/11/21 1307  ? Followed by  ? Melene Muller ON 11/12/2021] LORazepam (ATIVAN) tablet 1 mg  1 mg Oral TID Eliseo Gum B, MD      ? Followed by  ? Melene Muller ON 11/13/2021] LORazepam (ATIVAN) tablet 1 mg  1 mg Oral BID Eliseo Gum B, MD      ? Followed by  ? Melene Muller ON 11/14/2021] LORazepam (ATIVAN) tablet 1 mg  1 mg Oral Daily McQuilla, Gerlean Ren B, MD      ? multivitamin with minerals tablet 1 tablet  1 tablet Oral Daily Bobbye Morton, MD   1 tablet at 11/11/21 1307  ? neomycin-bacitracin-polymyxin (NEOSPORIN) ointment   Topical Daily Bobbye Morton, MD   1 application. at 11/11/21 0807  ? nicotine polacrilex (NICORETTE) gum 2 mg  2 mg Oral PRN Eliseo Gum B, MD      ? ondansetron (ZOFRAN-ODT) disintegrating tablet 4 mg  4 mg Oral Q6H PRN Eliseo Gum B, MD      ? thiamine tablet 100 mg  100 mg Oral Daily Eliseo Gum B,  MD   100 mg at 11/11/21 3428  ? ? ?Lab Results:  ?Results for orders placed or performed during the hospital encounter of 11/10/21 (from the past 48 hour(s))  ?Lipase, blood     Status: None  ? Collection Time: 11/10/21  6:12 PM  ?Result Value Ref  Range  ? Lipase 41 11 - 51 U/L  ?  Comment: Performed at [facility], [address], [place], [place] [address]  ?Vitamin B12     Status: None  ? Collection Time: 11/10/21  6:12 PM  ?Result Value Ref Range  ? Vitamin B-12 282 180 - 914 pg/mL  ?  Comment: (NOTE) ?This assay is not validated for testing neonatal or ?myeloproliferative syndrome specimens for Vitamin B12 levels. ?Performed at [facility], [address] ?[place], [place] [address] ?  ?Hepatitis panel, acute     Status: None  ? Collection Time: 11/10/21  6:12 PM  ?Result Value Ref Range  ? Hepatitis B Surface Ag NON REACTIVE NON REACTIVE  ? HCV Ab NON REACTIVE NON REACTIVE  ?  Comment: (NOTE) ?Nonreactive HCV antibody screen is consistent with no HCV infections,  ?unless recent infection is suspected or other evidence exists to ?indicate HCV infection. ? ?  ? Hep A IgM NON REACTIVE NON REACTIVE  ? Hep B C IgM NON REACTIVE NON REACTIVE  ?  Comment: Performed at [facility], [address], [place], [place] [address]  ?HIV Antibody (routine testing w rflx)     Status: None  ? Collection Time: 11/10/21  6:12 PM  ?Result Value Ref Range  ? HIV Screen 4th Generation wRfx Non Reactive Non Reactive  ?  Comment: Performed at [facility], [address], [place], [place] [address]  ?RPR     Status: None  ? Collection Time: 11/10/21  6:12 PM  ?Result Value Ref Range  ? RPR Ser Ql NON REACTIVE NON REACTIVE  ?  Comment: Performed at [facility], [address], [place], [place] [address]  ?Basic metabolic panel     Status: Abnormal  ? Collection Time: 11/10/21  6:12 PM  ?Result Value Ref Range  ? Sodium 136 135 - 145 mmol/L  ? Potassium 3.5 3.5 - 5.1 mmol/L  ?  Chloride 107 98 - 111 mmol/L  ? CO2 21 (L) 22 - 32 mmol/L  ? Glucose, Bld 214 (H) 70 - 99 mg/dL  ?  Comment: Glucose reference range applies only to samples taken after fasting for at least 8 hours.
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
## 2021-11-11 NOTE — [facility] Group Notes (Signed)
.  Psychoeducational Group Note ? ? ? ?Date:  4/22//23 ?Time: 1300-1400 ? ? ? ?Purpose of Group: . The group focus' on teaching patients on how to identify their needs and their Life Skills:  A group where two lists are made. What people need and what are things that we do that are unhealthy. The lists are developed by the patients and it is explained that we often do the actions that are not healthy to get our list of needs met. ? ?Goal:: to develop the coping skills needed to get their needs met ? ?Participation Level:  Active ? ?Participation Quality:  Appropriate ? ?Affect:  Appropriate ? ?Cognitive:  Oriented ? ?Insight:  Improving ? ?Engagement in Group:  Engaged ? ?Additional Comments: Pt rates energy at a 2/10 pticipated fully in the group. ? ?Ariana Burns A ?

## 2021-11-11 NOTE — [facility] Group Notes (Signed)
Goals Group ?4/22//2023 ? ? ?Group Focus: affirmation, clarity of thought, and goals/reality orientation ?Treatment Modality:  Psychoeducation ?Interventions utilized were assignment, group exercise, and support ?Purpose: To be able to understand and verbalize the reason for their admission to the hospital. To understand that the medication helps with their chemical imbalance but they also need to work on their choices in life. To be challenged to develop Burns list of 30 positives about themselves. Also introduce the concept that "feelings" are not reality. ? ?Participation Level:  did not attend ? ?Ariana Burns ?
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
## 2021-11-11 NOTE — Progress Notes (Signed)
Adult Psychoeducational Group Note ? ?Date:  11/11/2021 ?Time:  8:50 PM ? ?Group Topic/Focus:  ?Wrap-Up Group:   The focus of this group is to help patients review their daily goal of treatment and discuss progress on daily workbooks. ? ?Participation Level:  Active ? ?Participation Quality:  Appropriate ? ?Affect:  Appropriate ? ?Cognitive:  Appropriate ? ?Insight: Appropriate ? ?Engagement in Group:  Engaged ? ?Modes of Intervention:  Education and Exploration ? ?Additional Comments:  Patient attended and participated in group tonight.  They reports that today they learn that unhealthy choices can lead to bad results. ? ?Debe Coder ?11/11/2021, 8:50 PM ?

## 2021-11-11 NOTE — Progress Notes (Addendum)
D. Pt presented with a depressed affect and mood- mildly irritable upon initial approach at med window, complained that they didn't want to take medications that they knew nothing about. Pt's mood improved after this nurse educated them on their meds. Per pt's self inventory, pt rated their depression, hopelessness and anxiety a 8/8/7, respectively. Pt endorsed  passive SI without a plan and verbally contracts for safety.Pt has been visible in the milieu interacting well with peers, and observed attending group this am.  Pt denies A/VH, and does not appear to be responding to internal stimuli. A. Labs and vitals monitored. Pt given and educated on medications. Pt supported emotionally and encouraged to express concerns and ask questions.   ?R. Pt remains safe with 15 minute checks. Will continue POC. ? ?  ?

## 2021-11-12 MED ORDER — TOPIRAMATE 25 MG PO TABS
50.0000 mg | ORAL_TABLET | Freq: Every day | ORAL | Status: DC
  Administered 2021-11-12 – 2021-11-15 (×4): 50 mg via ORAL
  Filled 2021-11-12: qty 14
  Filled 2021-11-12 (×7): qty 2

## 2021-11-12 NOTE — [facility] Group Notes (Signed)
Adult Psychoeducational Group Not ?Date:  11/12/2021 ?Time:  5465-6812 ?Group Topic/Focus: PROGRESSIVE RELAXATION. Burns group where deep breathing is taught and tensing and relaxation muscle groups is used. Imagery is used as well.  Pts are asked to imagine 3 pillars that hold them up when they are not able to hold themselves up and to share that with the group. ? ?Participation Level:  Active ? ?Participation Quality:  Appropriate ? ?Affect:  Appropriate ? ?Cognitive:  Oriented ? ?Insight: Improving ? ?Engagement in Group:  Engaged ? ?Modes of Intervention:  Activity, Discussion, Education, and Support ? ?Additional Comments:  Rates his energy at Burns 4/10. States what holds them up is friends, music and quality time with friends. ? ?Ariana Burns ? ?

## 2021-11-12 NOTE — [facility] Group Notes (Signed)
Adult Psychoeducational Group  ?Date:  10/24/14/2023 ?Time:  1300-1400 ? ?Group Topic/Focus: Continuation of the group from Saturday. Looking at the lists that were created and talking about what needs to be done with the homework of 30 positives about themselves.  ?                                   Talking about taking their power back and helping themselves to develop a positive self esteem. ?     ?Participation Quality:  Appropriate ? ?Affect:  Appropriate ? ?Cognitive:  Oriented ? ?Insight: Improving ? ?Engagement in Group:  Engaged ? ?Modes of Intervention:  Activity, Discussion, Education, and Support ? ?Additional Comments:  Ariana Burns was upset when the group started. Was tearful. Was able to gather self together and participated fully in the group. ? ?Ariana Burns A ? ? ?

## 2021-11-12 NOTE — Progress Notes (Signed)
[facility] ? ?11/12/2021 1:26 PM ?Ariana Burns  ?MRN:  [id number] ? ?Subjective:  "I feel tired, nauseous, weepy and off-balance ." ? ?Brief History:  Ariana Burns is a 26 yo patient who uses they/ them pronouns, with a PPH of MDD and anxiety who [facility] after endorsing SI with a plan. ? ?Daily Notes 11/12/21: On assessment today, patient is examined in the office sitting calmly on the bed in his room. Chart reviewed and findings shared with the treatment team and discussed with Dr. Loleta ChanceHill. Alert and oriented to name, time, place and situation. Good eye contact during encounter. Speech is fluent and with normal pattern and volume. Affect is constricted and mood is anxious, depressed and irritable. Crying spells noted. Memory, judgment and insight is fair. Thought process is coherent but linear (Difficulty remembering thing sometimes). Added, "I have some memory issues and cannot remember most things due to taking a lot of non-prescribed xanax.  ? ?Endorsed transient SI, without plans or means. Endorsed transient VH, stating, "Seeing shadows moving around and specks on the floor, that I thought they were moving, however when touched found out they were dots on the floor." Denied HI/AH. Endorsed 5 hours of sleep last night. Rated depression as "4" and anxiety as "5" on a scale of 0 to 10.  Appetite is fair, consuming 65% of meals. Expressed concern about the personality he is experiencing - black spitting to white, love splitting to hate and angry splitting to happy all at the same time. Will discuss with Dr. 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 Pending Labs Reviewed on 11/11/21 include: TSH - 0.576,  A1C - 4.9, Lipid Profile - Normal, Lipase - 41, B12 - 282, Hepatitis Panel - Normal, Glucose 214, HIV Test - Non reactive, Hepatitis A/B/C - Non reactive, GC/Chlamydia/ RPR - Non Reactive. Instructions provided on polysubstance use cessation  due to positive THC and cocaine in urine and alcohol use dependence. Continues on Detox protocol. ?  ?Principal Problem: MDD (major depressive disorder), recurrent episode, severe (HCC) ? ?Diagnosis: Principal Problem: ?  MDD (major depressive disorder), recurrent episode, severe (HCC) ?Active Problems: ?  Social anxiety disorder ?  PTSD (post-traumatic stress disorder) ?  Alcohol use disorder ?  Sedative, hypnotic or anxiolytic use disorder, mild, abuse (HCC) ?  Cannabis use disorder, severe, dependence (HCC) ? ?Total Time spent with patient: 30 minutes ? ?Past Psychiatric History: [facility] and 06/2014 dx with MDD ? ?Past Medical History:  ?Past Medical History:  ?Diagnosis Date  ? Anxiety   ? Depression   ? Medical history non-contributory   ? History reviewed. No pertinent surgical history. ?Family History:  ?Family History  ?Problem Relation Age of Onset  ? Hypertension Maternal Grandfather   ? Depression Mother   ? Depression Father   ? ?Family Psychiatric  History: Mom: [facility] and has a SA ? ?Social History:  ?Social History  ? ?Substance and Sexual Activity  ?Alcohol Use Yes  ? Alcohol/week: 6.0 standard drinks  ? Types: 6 Shots of liquor per week  ? Comment: "I take 5-6 shots about 5-7 days / week"  ?   ?Social History  ? ?Substance and Sexual Activity  ?Drug Use Yes  ? Types: Marijuana  ?  ?Social History  ? ?Socioeconomic History  ? Marital status: Single  ?  Spouse name: Not on file  ? Number  of children: Not on file  ? Years of education: Not on file  ? Highest education level: Not on file  ?Occupational History  ? Not on file  ?Tobacco Use  ? Smoking status: Former  ?  Packs/day: 0.25  ?  Types: Cigarettes  ? Smokeless tobacco: Never  ?Vaping Use  ? Vaping Use: Every day  ? Devices: "I vape nicotine, delta 8 and marijuana"  ?Substance and Sexual Activity  ? Alcohol use: Yes  ?  Alcohol/week: 6.0 standard drinks  ?  Types: 6 Shots of liquor per  week  ?  Comment: "I take 5-6 shots about 5-7 days / week"  ? Drug use: Yes  ?  Types: Marijuana  ? Sexual activity: Yes  ?  Birth control/protection: None  ?Other Topics Concern  ? Not on file  ?Social History Narrative  ? Single and currently lives with paternal grandparents.  ? ?Social Determinants of Health  ? ?Financial Resource Strain: Not on file  ?Food Insecurity: Not on file  ?Transportation Needs: Not on file  ?Physical Activity: Not on file  ?Stress: Not on file  ?Social Connections: Not on file  ? ?Additional Social History:  ?  ?Sleep: Good ? ?Appetite:  Good ? ?Current Medications: ?Current Facility-Administered Medications  ?Medication Dose Route Frequency Provider Last Rate Last Admin  ? ARIPiprazole (ABILIFY) tablet 5 mg  5 mg Oral Q2000 Eliseo Gum B, MD   5 mg at 11/11/21 2105  ? FLUoxetine (PROZAC) capsule 10 mg  10 mg Oral Daily Eliseo Gum B, MD   10 mg at 11/12/21 0740  ? gabapentin (NEURONTIN) capsule 100 mg  100 mg Oral TID Eliseo Gum B, MD   100 mg at 11/12/21 1300  ? hydrOXYzine (ATARAX) tablet 25 mg  25 mg Oral TID PRN Massengill, Harrold Donath, MD      ? hydrOXYzine (ATARAX) tablet 25 mg  25 mg Oral Q6H PRN Eliseo Gum B, MD      ? ibuprofen (ADVIL) tablet 400 mg  400 mg Oral Q6H PRN Hill, Shelbie Hutching, MD      ? loperamide (IMODIUM) capsule 2-4 mg  2-4 mg Oral PRN Eliseo Gum B, MD      ? LORazepam (ATIVAN) tablet 1 mg  1 mg Oral Q6H PRN Eliseo Gum B, MD   1 mg at 11/12/21 0741  ? LORazepam (ATIVAN) tablet 1 mg  1 mg Oral TID Eliseo Gum B, MD   1 mg at 11/12/21 1300  ? Followed by  ? Melene Muller ON 11/13/2021] LORazepam (ATIVAN) tablet 1 mg  1 mg Oral BID Eliseo Gum B, MD      ? Followed by  ? Melene Muller ON 11/14/2021] LORazepam (ATIVAN) tablet 1 mg  1 mg Oral Daily Eliseo Gum B, MD      ? multivitamin with minerals tablet 1 tablet  1 tablet Oral Daily Bobbye Morton, MD   1 tablet at 11/12/21 0741  ? neomycin-bacitracin-polymyxin (NEOSPORIN) ointment   Topical Daily Bobbye Morton, MD   Given at 11/12/21 6160  ? nicotine polacrilex (NICORETTE) gum 2 mg  2 mg Oral PRN Eliseo Gum B, MD      ? ondansetron (ZOFRAN-ODT) disintegrating tablet 4 mg  4 mg Oral Q6H PRN Eliseo Gum B, MD   4 mg at 11/12/21 1022  ? thiamine tablet 100 mg  100 mg Oral Daily Eliseo Gum B, MD   100 mg at 11/12/21 0741  ? ? ?Lab Results:  ?Results for  orders placed or performed during the hospital encounter of 11/10/21 (from the past 48 hour(s))  ?Lipase, blood     Status: None  ? Collection Time: 11/10/21  6:12 PM  ?Result Value Ref Range  ? Lipase 41 11 - 51 U/L  ?  Comment: Performed at [facility], [address] [place], [place] [address]  ?Vitamin B12     Status: None  ? Collection Time: 11/10/21  6:12 PM  ?Result Value Ref Range  ? Vitamin B-12 282 180 - 914 pg/mL  ?  Comment: (NOTE) ?This assay is not validated for testing neonatal or ?myeloproliferative syndrome specimens for Vitamin B12 levels. ?Performed at [facility], [address] ?[place], [place] [address] ?  ?Hepatitis panel, acute     Status: None  ? Collection Time: 11/10/21  6:12 PM  ?Result Value Ref Range  ? Hepatitis B Surface Ag NON REACTIVE NON REACTIVE  ? HCV Ab NON REACTIVE NON REACTIVE  ?  Comment: (NOTE) ?Nonreactive HCV antibody screen is consistent with no HCV infections,  ?unless recent infection is suspected or other evidence exists to ?indicate HCV infection. ? ?  ? Hep A IgM NON REACTIVE NON REACTIVE  ? Hep B C IgM NON REACTIVE NON REACTIVE  ?  Comment: Performed at [facility], [address] [place], [place] [address]  ?HIV Antibody (routine testing w rflx)     Status: None  ? Collection Time: 11/10/21  6:12 PM  ?Result Value Ref Range  ? HIV Screen 4th Generation wRfx Non Reactive Non Reactive  ?  Comment: Performed at [facility], [address], [place], [place] [address]  ?RPR     Status: None  ? Collection Time: 11/10/21  6:12 PM  ?Result Value Ref Range  ? RPR  Ser Ql NON REACTIVE NON REACTIVE  ?  Comment: Performed at [facility], [address], [place], [place] [address]  ?Basic metabolic panel     Status: Abnormal  ? Collection Time: 11/10/21  6:12

## 2021-11-12 NOTE — Progress Notes (Addendum)
D. Pt observed tearful on the phone this am. Pt reported that they were talking to their grandmother who pt said was "being nosy", and "asking too many questions." Pt brightened at the med window, and their mood improved during interaction. Pt reported that their tolerating medications well, denied pain and withdrawal symptoms. Per pt's self inventory, pt rated their depression, hopelessness and anxiety a 5/8/6, respectively. Pt reported that their goal was to try and figure out what they want to do with their living situation, and mentioned that they didn't want to live with their grandmother.Pt has been visible on the unit observed interacting well with peers.  Pt currently denies SI/HI and AVH .  ?A. Labs and vitals monitored. Pt given and educated on medications. Pt supported emotionally and encouraged to express concerns and ask questions.   ?R. Pt remains safe with 15 minute checks. Will continue POC. ? ?  ?

## 2021-11-12 NOTE — Group Note (Signed)
[facility] LCSW Group Therapy Note ? ?11/12/2021   ? ?Type of Therapy and Topic:  Group Therapy:  Adding Supports Including Yourself ? ?Participation Level:  Minimal  ? ?Description of Group:   Patients in this group were introduced to the concept that additional supports including self-support are an essential part of recovery.  Patients listed their current healthy and unhealthy supports, and discussed the difference between the two.   Several songs were played and a group discussion ensued in which patients stated they could relate to the songs which inspired them to realize they have be willing to help themselves in order to succeed, because other people cannot achieve sobriety or stability for them.  Parents were encouraged toward self-advocacy and self-support as part of their recovery.  They discussed their reactions to these songs' messages, which were positive and hopeful.  Before group ended, they identified the supports they believe they need to add to their lives to achieve their goals at discharge.  ? ?Therapeutic Goals: ?1)  explain the difference between healthy and unhealthy supports and discuss what specific supports are currently in patients' lives ?2)  demonstrate the importance of being a key part of one's own support system ?3)  discuss the need for appropriate boundaries with supports ?4)  elicit ideas from patients about supports that need to be added in order to achieve goals ?  ?Summary of Patient Progress:   The patient listed current healthy supports as friends and current unhealthy supports as drugs, alcohol, and grandparents.  The patient left group early, did not return. ? ?Therapeutic Modalities:   ?Motivational Interviewing ?Activity ? ?Lynnell Chad  ? ?   ?

## 2021-11-12 NOTE — Progress Notes (Signed)
?   11/11/21 2100  ?Psych Admission Type (Psych Patients Only)  ?Admission Status Voluntary  ?Psychosocial Assessment  ?Patient Complaints Sadness  ?Eye Contact Fair  ?Facial Expression Sad  ?Affect Sad  ?Speech Logical/coherent  ?Interaction Assertive  ?Motor Activity Other (Comment) ?(WDL)  ?Appearance/Hygiene Disheveled  ?Behavior Characteristics Cooperative;Appropriate to situation  ?Mood Sad;Pleasant  ?Thought Process  ?Coherency Circumstantial  ?Content WDL  ?Delusions None reported or observed  ?Perception WDL  ?Hallucination None reported or observed  ?Judgment Poor  ?Confusion None  ?Danger to Self  ?Current suicidal ideation? Passive  ?Agreement Not to Harm Self Yes  ?Description of Agreement Verbal Contract  ?Danger to Others  ?Danger to Others None reported or observed  ? ? ?

## 2021-11-12 NOTE — Progress Notes (Signed)
Adult Psychoeducational Group Note ? ?Date:  11/12/2021 ?Time:  9:32 PM ? ?Group Topic/Focus:  ?Wrap-Up Group:   The focus of this group is to help patients review their daily goal of treatment and discuss progress on daily workbooks. ? ?Participation Level:  Active ? ?Participation Quality:  Appropriate ? ?Affect:  Appropriate ? ?Cognitive:  Appropriate ? ?Insight: Appropriate ? ?Engagement in Group:  Engaged ? ?Modes of Intervention:  Education and Exploration ? ?Additional Comments:  Patient attended and participated in group tonight. They reports the significant thing that happened to her today wast they feel they are getting closer  to the right diagnosis therefore the right medication. ? ?Debe Coder ?11/12/2021, 9:32 PM ?

## 2021-11-13 ENCOUNTER — Encounter (HOSPITAL_COMMUNITY): Payer: Self-pay

## 2021-11-13 LAB — BASIC METABOLIC PANEL
Anion gap: 8 (ref 5–15)
BUN: 14 mg/dL (ref 6–20)
CO2: 25 mmol/L (ref 22–32)
Calcium: 9.3 mg/dL (ref 8.9–10.3)
Chloride: 104 mmol/L (ref 98–111)
Creatinine, Ser: 0.95 mg/dL (ref 0.44–1.00)
GFR, Estimated: >60 mL/min (ref >60)
Glucose, Bld: 106 mg/dL — ABNORMAL HIGH (ref 70–99)
Potassium: 3.7 mmol/L (ref 3.5–5.1)
Sodium: 137 mmol/L (ref 135–145)

## 2021-11-13 LAB — VITAMIN B1: Vitamin B1 (Thiamine): 197.5 nmol/L (ref 66.5–200.0)

## 2021-11-13 LAB — GC/CHLAMYDIA PROBE AMP (~~LOC~~) NOT AT ARMC
Chlamydia: NEGATIVE
Comment: NEGATIVE
Comment: NORMAL
Neisseria Gonorrhea: NEGATIVE

## 2021-11-13 MED ORDER — FLUOXETINE HCL 20 MG PO CAPS
20.0000 mg | ORAL_CAPSULE | Freq: Every day | ORAL | Status: DC
  Administered 2021-11-14 – 2021-11-15 (×2): 20 mg via ORAL
  Filled 2021-11-13 (×2): qty 1
  Filled 2021-11-13: qty 7
  Filled 2021-11-13 (×2): qty 1

## 2021-11-13 MED ORDER — TRAZODONE HCL 50 MG PO TABS
50.0000 mg | ORAL_TABLET | Freq: Every evening | ORAL | Status: DC | PRN
  Administered 2021-11-13: 50 mg via ORAL
  Filled 2021-11-13: qty 1

## 2021-11-13 NOTE — Progress Notes (Addendum)
Pt denies SI/HI/AVH and verbally agrees to approach staff if these become apparent or before harming themselves/others. Rates depression 1/10. Rates anxiety 3/10. Rates pain 0/10. Pt has been animated and interacting well with others. Pt stated that they wanted the BPD diagnosis on their list of diagnoses/problem list. Pt stated that they were having some nausea with no vomiting. Pt was concerned it was the medication but was educated. Scheduled medications administered to pt, per MD orders. RN provided support and encouragement to pt. Q15 min safety checks implemented and continued. Pt safe on the unit. RN will continue to monitor and intervene as needed.  ? 11/13/21 0742  ?Psych Admission Type (Psych Patients Only)  ?Admission Status Voluntary  ?Psychosocial Assessment  ?Patient Complaints None  ?Eye Contact Fair  ?Facial Expression Animated  ?Affect Appropriate to circumstance  ?Speech Logical/coherent  ?Interaction Assertive  ?Motor Activity Other (Comment) ?(WDL)  ?Appearance/Hygiene Unremarkable  ?Behavior Characteristics Cooperative;Appropriate to situation;Calm  ?Mood Euphoric;Pleasant  ?Thought Process  ?Coherency WDL  ?Content WDL  ?Delusions None reported or observed  ?Perception WDL  ?Hallucination None reported or observed  ?Judgment Limited  ?Confusion None  ?Danger to Self  ?Current suicidal ideation? Denies  ?Danger to Others  ?Danger to Others None reported or observed  ? ? ?

## 2021-11-13 NOTE — Group Note (Signed)
Occupational Therapy Group Note ? ?Group Topic:Communication  ?Group Date: 11/13/2021 ?Start Time: 1400 ?End Time: 1500 ?Facilitators: Ted Mcalpine, OT  ? ?Group Description: Group encouraged increased engagement and participation through discussion focused on communication styles. Patients were educated on the different styles of communication including passive, aggressive, assertive, and passive-aggressive communication. Group members shared and reflected on which styles they most often find themselves communicating in and brainstormed strategies on how to transition and practice a more assertive approach. Further discussion explored how to use assertiveness skills and strategies to further advocate and ask questions as it relates to their treatment plan and mental health.  ? ?Therapeutic Goal(s): ?Identify practical strategies to improve communication skills  ?Identify how to use assertive communication skills to address individual needs and wants ? ? ?Participation Level: Active and Engaged ?  ?Participation Quality: Independent ?  ?Behavior: Alert, Appropriate, Attentive , Cooperative, Eager, and Interactive  ?  ?Speech/Thought Process: Focused, Ideas of reference , Organized, and Relevant ?  ?Affect/Mood: Appropriate ?  ?Insight: Good ?  ?Judgement: Good ?  ?Individualization: Pt was engaged and appropriate in their participation of group discussion/activity. Effective communication identified  ?Modes of Intervention: Activity, Clarification, Discussion, Education, Problem-solving, Role-play, Socialization, and Support  ?Patient Response to Interventions:  Attentive, Engaged, Interested , Receptive, Requested additional information/resources , and Requested 1:1 post group intervention  ?  ?Plan: Continue to engage patient in OT groups 2 - 3x/week. ? ?11/13/2021  ?Ted Mcalpine, OT ? ? ?

## 2021-11-13 NOTE — [facility] Group Notes (Signed)
PT was informed but did not attend group. ?

## 2021-11-13 NOTE — [facility] Suicide Risk Assessment (Signed)
[facility] INPATIENT:  Family/Significant Other Suicide Prevention Education ? ?Suicide Prevention Education:  ?Education Completed; [place] Deirdre Pippins [phone] Curator) has been identified by the patient as the family member/significant other with whom the patient will be residing, and identified as the person(s) who will aid the patient in the event of a mental health crisis (suicidal ideations/suicide attempt).  With written consent from the patient, the family member/significant other has been provided the following suicide prevention education, prior to the and/or following the discharge of the patient. ? ?The suicide prevention education provided includes the following: ?Suicide risk factors ?Suicide prevention and interventions ?National Suicide Hotline telephone number ?[facility] assessment telephone number ?[facility] Emergency Assistance 911 ?[place] and/or Residential Mobile Crisis Unit telephone number ? ?Request made of family/significant other to: ?Remove weapons (e.g., guns, rifles, knives), all items previously/currently identified as safety concern.   ?Remove drugs/medications (over-the-counter, prescriptions, illicit drugs), all items previously/currently identified as a safety concern. ? ?The family member/significant other verbalizes understanding of the suicide prevention education information provided.  The family member/significant other agrees to remove the items of safety concern listed above. ? ?CSW spoke with Ms. Deirdre Pippins who states that her partner has been experiencing suicidal thoughts and self-harm.  She states that her partner has expressed these thoughts to her and also has no been taking her medications in several months.  She states that after discharge her partner will be staying with their friend Merrilee Seashore and then occasionally with 2 other friends until more permanent housing can be established.  Ms. Deirdre Pippins states that her partner does not want to continue  living with their grandmother because their grandmother "belittles them about their mental health symptoms".  She states that her partner will be staying in [facility] after discharge and has no access to any firearms or weapons at this time.  CSW completed SPE with Ms. Deirdre Pippins.  ? ?Darleen Crocker ?11/13/2021, 10:56 AM ?

## 2021-11-13 NOTE — BH IP Treatment Plan (Signed)
Interdisciplinary Treatment and Diagnostic Plan Update ? ?11/13/2021 ?Time of Session: 10:30am ?Ariana Burns ?MRN: [id number] ? ?Principal Diagnosis: MDD (major depressive disorder), recurrent episode, severe ([place]) ? ?Secondary Diagnoses: Principal Problem: ?  MDD (major depressive disorder), recurrent episode, severe ([place]) ?Active Problems: ?  Social anxiety disorder ?  PTSD (post-traumatic stress disorder) ?  Alcohol use disorder ?  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  Cannabis use disorder, severe, dependence ([place]) ? ? ?Current Medications:  ?Current Facility-Administered Medications  ?Medication Dose Route Frequency Provider Last Rate Last Admin  ? ARIPiprazole (ABILIFY) tablet 5 mg  5 mg Oral Q2000 Damita Dunnings B, MD   5 mg at 11/12/21 2100  ? FLUoxetine (PROZAC) capsule 10 mg  10 mg Oral Daily Damita Dunnings B, MD   10 mg at 11/13/21 9179  ? gabapentin (NEURONTIN) capsule 100 mg  100 mg Oral TID Damita Dunnings B, MD   100 mg at 11/13/21 1505  ? hydrOXYzine (ATARAX) tablet 25 mg  25 mg Oral TID PRN Janine Limbo, MD   25 mg at 11/12/21 2116  ? hydrOXYzine (ATARAX) tablet 25 mg  25 mg Oral Q6H PRN Damita Dunnings B, MD      ? ibuprofen (ADVIL) tablet 400 mg  400 mg Oral Q6H PRN Maida Sale, MD   400 mg at 11/12/21 2115  ? loperamide (IMODIUM) capsule 2-4 mg  2-4 mg Oral PRN Damita Dunnings B, MD      ? LORazepam (ATIVAN) tablet 1 mg  1 mg Oral Q6H PRN Damita Dunnings B, MD      ? LORazepam (ATIVAN) tablet 1 mg  1 mg Oral BID Damita Dunnings B, MD   1 mg at 11/13/21 6979  ? Followed by  ? Derrill Memo ON 11/14/2021] LORazepam (ATIVAN) tablet 1 mg  1 mg Oral Daily McQuilla, Jai B, MD      ? multivitamin with minerals tablet 1 tablet  1 tablet Oral Daily Freida Busman, MD   1 tablet at 11/13/21 4801  ? neomycin-bacitracin-polymyxin (NEOSPORIN) ointment   Topical Daily Freida Busman, MD   Given at 11/12/21 6553  ? nicotine polacrilex (NICORETTE) gum 2 mg  2 mg Oral PRN Damita Dunnings  B, MD      ? ondansetron (ZOFRAN-ODT) disintegrating tablet 4 mg  4 mg Oral Q6H PRN Damita Dunnings B, MD   4 mg at 11/12/21 1022  ? thiamine tablet 100 mg  100 mg Oral Daily Damita Dunnings B, MD   100 mg at 11/13/21 7482  ? topiramate (TOPAMAX) tablet 50 mg  50 mg Oral Daily Ntuen, Tina C, FNP   50 mg at 11/13/21 7078  ? ?PTA Medications: ?Medications Prior to Admission  ?Medication Sig Dispense Refill Last Dose  ? albuterol (VENTOLIN HFA) 108 (90 Base) MCG/ACT inhaler Inhale 1-2 puffs into the lungs every 6 (six) hours as needed for wheezing or shortness of breath. 8 g 0   ? ibuprofen (ADVIL) 200 MG tablet Take 400 mg by mouth every 6 (six) hours as needed for headache or moderate pain.     ? ? ?Patient Stressors: Financial difficulties   ?Marital or family conflict   ?Medication change or noncompliance   ?Substance abuse   ?Traumatic event   ? ?Patient Strengths: Capable of independent living  ?Communication skills  ?Financial means  ?Physical Health  ?Supportive family/friends  ?Work skills  ? ?Treatment Modalities: Medication Management, Group therapy, Case management,  ?1 to 1 session with  clinician, Psychoeducation, Recreational therapy. ? ? ?Physician Treatment Plan for Primary Diagnosis: MDD (major depressive disorder), recurrent episode, severe ([place]) ?Long Term Goal(s): Improvement in symptoms so as ready for discharge  ? ?Short Term Goals: Ability to identify changes in lifestyle to reduce recurrence of condition will improve ?Ability to verbalize feelings will improve ?Ability to disclose and discuss suicidal ideas ?Ability to demonstrate self-control will improve ?Ability to identify and develop effective coping behaviors will improve ?Ability to maintain clinical measurements within normal limits will improve ?Compliance with prescribed medications will improve ?Ability to identify triggers associated with substance abuse/mental health issues will improve ? ?Medication Management: Evaluate patient's  response, side effects, and tolerance of medication regimen. ? ?Therapeutic Interventions: 1 to 1 sessions, Unit Group sessions and Medication administration. ? ?Evaluation of Outcomes: Not Met ? ?Physician Treatment Plan for Secondary Diagnosis: Principal Problem: ?  MDD (major depressive disorder), recurrent episode, severe ([place]) ?Active Problems: ?  Social anxiety disorder ?  PTSD (post-traumatic stress disorder) ?  Alcohol use disorder ?  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  Cannabis use disorder, severe, dependence ([place]) ? ?Long Term Goal(s): Improvement in symptoms so as ready for discharge  ? ?Short Term Goals: Ability to identify changes in lifestyle to reduce recurrence of condition will improve ?Ability to verbalize feelings will improve ?Ability to disclose and discuss suicidal ideas ?Ability to demonstrate self-control will improve ?Ability to identify and develop effective coping behaviors will improve ?Ability to maintain clinical measurements within normal limits will improve ?Compliance with prescribed medications will improve ?Ability to identify triggers associated with substance abuse/mental health issues will improve    ? ?Medication Management: Evaluate patient's response, side effects, and tolerance of medication regimen. ? ?Therapeutic Interventions: 1 to 1 sessions, Unit Group sessions and Medication administration. ? ?Evaluation of Outcomes: Not Met ? ? ?RN Treatment Plan for Primary Diagnosis: MDD (major depressive disorder), recurrent episode, severe ([place]) ?Long Term Goal(s): Knowledge of disease and therapeutic regimen to maintain health will improve ? ?Short Term Goals: Ability to remain free from injury will improve, Ability to verbalize frustration and anger appropriately will improve, Ability to demonstrate self-control, Ability to participate in decision making will improve, Ability to identify and develop effective coping behaviors will improve, and  Compliance with prescribed medications will improve ? ?Medication Management: RN will administer medications as ordered by provider, will assess and evaluate patient's response and provide education to patient for prescribed medication. RN will report any adverse and/or side effects to prescribing provider. ? ?Therapeutic Interventions: 1 on 1 counseling sessions, Psychoeducation, Medication administration, Evaluate responses to treatment, Monitor vital signs and CBGs as ordered, Perform/monitor CIWA, COWS, AIMS and Fall Risk screenings as ordered, Perform wound care treatments as ordered. ? ?Evaluation of Outcomes: Not Met ? ? ?LCSW Treatment Plan for Primary Diagnosis: MDD (major depressive disorder), recurrent episode, severe ([place]) ?Long Term Goal(s): Safe transition to appropriate next level of care at discharge, Engage patient in therapeutic group addressing interpersonal concerns. ? ?Short Term Goals: Engage patient in aftercare planning with referrals and resources, Increase social support, Increase ability to appropriately verbalize feelings, Increase emotional regulation, Facilitate patient progression through stages of change regarding substance use diagnoses and concerns, Identify triggers associated with mental health/substance abuse issues, and Increase skills for wellness and recovery ? ?Therapeutic Interventions: Assess for all discharge needs, 1 to 1 time with Social worker, Explore available resources and support systems, Assess for adequacy in community support network, Educate family and significant  other(s) on suicide prevention, Complete Psychosocial Assessment, Interpersonal group therapy. ? ?Evaluation of Outcomes: Not Met ? ? ?Progress in Treatment: ?Attending groups: Yes. ?Participating in groups: Yes. ?Taking medication as prescribed: Yes. ?Toleration medication: Yes. ?Family/Significant other contact made: Yes, individual(s) contacted:  partner  ?Patient understands diagnosis:  Yes. ?Discussing patient identified problems/goals with staff: Yes. ?Medical problems stabilized or resolved: Yes. ?Denies suicidal/homicidal ideation: Yes. ?Issues/concerns per patient self-inventory: No. ? ? ?New probl

## 2021-11-13 NOTE — Group Note (Signed)
Recreation Therapy Group Note ? ? ?Group Topic:Stress Management  ?Group Date: 11/13/2021 ?Start Time: 0930 ?End Time: 0950 ?Facilitators: Caroll Rancher, LRT,CTRS ?Location: [address] Dayroom ? ? ?Goal Area(s) Addresses:  ?Patient will actively participate in stress management techniques presented during session.  ?Patient will successfully identify benefit of practicing stress management post d/c.  ? ?Group Description: Guided Imagery. LRT provided education, instruction, and demonstration on practice of visualization via guided imagery. Patient was asked to participate in the technique introduced during session. LRT debriefed including topics of mindfulness, stress management and specific scenarios each patient could use these techniques. Patients were given suggestions of ways to access scripts post d/c and encouraged to explore Youtube and other apps available on smartphones, tablets, and computers. ? ? ?Affect/Mood: Appropriate ?  ?Participation Level: Active ?  ?Participation Quality: Independent ?  ?Behavior: Appropriate ?  ?Speech/Thought Process: Focused ?  ?Insight: Good ?  ?Judgement: Good ?  ?Modes of Intervention: Script, Ashby Dawes Sounds ?  ?Patient Response to Interventions:  Engaged ?  ?Education Outcome: ? Acknowledges education and In group clarification offered   ? ?Clinical Observations/Individualized Feedback: Pt attended and participated in group session.  ?  ? ?Plan: Continue to engage patient in RT group sessions 2-3x/week. ? ? ?Caroll Rancher, LRT,CTRS ?11/13/2021 11:30 AM ?

## 2021-11-13 NOTE — BHH Counselor (Signed)
CSW spoke with the Pt who states that at discharge they do not want to go back to their grandmother's home.  They state that at discharge they will be staying with friends and that the first friend will be "Merrilee Seashore".  She states that her father also has a bedroom ready for her when she wants to stay there as well.  She states that she has spoken with her father and her partner, [place] about her plan and feels comfortable with her living situation after discharge.  She states that she has a large friend support group that will help her and does not have any concerns with her plan at this time.  CSW will inform the providers of the Pt's plans for living arrangements after discharge.  ?

## 2021-11-13 NOTE — Group Note (Signed)
LCSW Group Therapy Note ? ? ?Group Date: 11/13/2021 ?Start Time: 1300 ?End Time: 1400 ? ?Type of Therapy and Topic:  Group Therapy: Understanding the differences between fear and anxiety / Fear Ladder & Examining the Evidence ? ?Participation Level:  Active ? ? ?Description of Group:   ?In this group session, patients learned how to define and recognize the similarities differences between fear and anxiety. Patients will explore reactions we have to fear and anxiety. Patients identified a fear or something they feel anxious about. Patients analyzed scenarios and discussed both the positive and negative aspects to them. Patients were asked to identify if it is a fear or anxiety as well.  Patients were asked to provide their own examples. Patients will learn what to do for both fear and anxiety. CSW provided tools such as breaking things up into smaller steps, challenging automatic negative thinking / questions to ask, fear ladder and how to use gradual exposure. Patients will be asked to practice each tool with situations and to complete a fear ladder for their personal gain.  ? ?Therapeutic Goals: ?Patients will learn the difference between fear versus anxiety and the definitions of both. ?Patients will utilize scenarios and be able to identify if this is an example of a fear or anxiety. ?Patients will learn tools to handle both their fears and anxieties as well as discuss breaking it into smaller steps and exposure.  ?Patients will be asked to provide examples of their own and discuss how things have both positive and negative aspects.  ?Patients were provided questions to ask to challenge automatic negative thinking for anxiety.  ?Patients were provided a sample form of a fear ladder and asked to complete one for a fear.  ? ?Summary of Patient Progress:  Patient was engaged and participated in the group session. Patient shared their understanding of fear and anxiety. Patient shared one fear and one anxiety. Patient  reports their plan to help with fear/anxiety to be communication and talking to their support system more often.  ? ?Therapeutic Modalities:   ?Cognitive Behavioral Therapy ?Motivational Interviewing  ?Brief Therapy ? ?Aram Beecham, LCSWA ?11/13/2021  1:52 PM   ? ?

## 2021-11-13 NOTE — Progress Notes (Signed)
Patient compliant with medications, no adverse drug noted. Stated they are worried about accomodation once discharge They do not want to go and live with Their [place] Mother.  ? ?Support and encouragement provided. Routine safety checks conducted every 15 minutes. Patient notified to inform staff with problems or concerns. ? ?No adverse drug reactions noted. Patient contracts for safety at this time. Will continue to monitor Patient.   ?

## 2021-11-13 NOTE — BHH Group Notes (Addendum)
Spiritual care group on grief and loss facilitated by chaplain Dyanne Carrel, [facility]  ? ?Group Goal:  ? ?Support / Education around grief and loss  ? ?Members engage in facilitated group support and psycho-social education.  ? ?Group Description:  ? ?Following introductions and group rules, group members engaged in facilitated group dialog and support around topic of loss, with particular support around experiences of loss in their lives. Group Identified types of loss (relationships / self / things) and identified patterns, circumstances, and changes that precipitate losses. Reflected on thoughts / feelings around loss, normalized grief responses, and recognized variety in grief experience. Group noted Worden's four tasks of grief in discussion.  ? ?Group drew on Adlerian / Rogerian, narrative, MI,  ? ?Patient Progress:  Ariana Burns attended group and was an actively participated and engaged in the conversation.  He shared about some of his experiences with loss and how he copes.  His medicine made him sleepy and he commented on that several times. ? ?[employer], B[facility] ?Pager, [phone] ? ?

## 2021-11-13 NOTE — Progress Notes (Signed)
Patient requested for sleep medications PRN trazodone given and Patient later said I don't want to take Trazodone. I had a hard time to stop it when I was 18 I had withdrawals from it. I want some other sleep medications like Seroquel not trazodone. Patient was educated and Provider notified to d/c the Trazodone per Patient request. ? ?Support and encouragement provided. No adverse drug noted.  ?

## 2021-11-13 NOTE — Progress Notes (Addendum)
[facility] MD Progress Note ? ?11/13/2021 4:27 PM ?Jared Sasek  ?MRN:  [id number] ? ? ? ?Subjective:   ?Ariana Burns is a 26 year old patient who uses they/them pronouns, with a past psychiatric history of major depressive disorder and anxiety who was transferred from [facility] to [facility] on 04/21 after endorsing suicidal ideation with a plan to harm themself. ? 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  ?The psychiatry team made the following recommendations on admission: ?- CIWA with scheduled Ativan taper ?- Continue Prozac 10mg , titrate up as tolerated for depression and anxiety and PTSD ?- Continue Abilify 5mg  QHS, adjunct for tx of depression and mood stabilization. ?- Start Topamax 50 mg PO daily for mood stabilization and alcohol use disorder ?  ?On examination today, the patient reports they are less depressed. They feel the mood stabilizers are helping. The patient notes that this past weekend has been "tough" as they have been going through alcohol withdrawal symptoms, including vomiting and nausea. They state that they had to leave group last night to vomit. However, the patient says they feel better today with only a stomach ache and no vomiting. They mention that they cried a lot yesterday. The patient also mentions a difficult conversation they had with their grandmother over the phone this weekend, which has led them to not want to return to previous housing at their grandmother's home. Instead, they will be living with a friend named Ariana Burns as well as potentially other friends. They also have a place to stay with their father, though that is more tenuous as the father has a "history of being wishy washy." Reports that  sleep is up and down. Reports that appetite is okay eating every meal but unknown amounts per meal. Concentration is reported to be better. The patient endorses suicidal ideation without a plan as recently as yesterday. She denies having homicidal ideation, or auditory/visual hallucinations.  ?She reports some braing fog. She otherwise denies having s/e to current scheduled psych meds.  ? ? ?Principal Problem: MDD (major depressive disorder), recurrent episode, severe ([place]) ?Diagnosis: Principal Problem: ?  MDD (major depressive disorder), recurrent episode, severe ([place]) ?Active Problems: ?  Social anxiety disorder ?  PTSD (post-traumatic stress disorder) ?  Alcohol use disorder ?  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  Cannabis use disorder, severe, dependence ([place]) ? ?Total Time spent with patient: 20 minutes ? ?Past Psychiatric History: 2 prior hospitalizations at [facility] C/1 in 06/2013 and 06/2014 dx with MDD ? ?Past Medical History:  ?Past Medical History:  ?Diagnosis Date  ? Anxiety   ? Depression   ? Medical history non-contributory   ? History reviewed. No pertinent surgical history. ?Family History:  ?Family History  ?Problem Relation Age of Onset  ? Hypertension Maternal Grandfather   ? Depression Mother   ? Depression Father   ? ?Family Psychiatric  History: Mom: Has been hospitalized at [facility] and has a SA ? ?Social History:  ?Social History  ? ?Substance and Sexual Activity  ?Alcohol Use Yes  ? Alcohol/week: 6.0 standard drinks  ? Types: 6 Shots of liquor per week  ? Comment: "I take 5-6 shots about 5-7 days / week"  ?   ?Social History  ? ?Substance and  Sexual Activity  ?Drug Use Yes  ? Types: Marijuana  ?  ?Social History  ? ?Socioeconomic History  ? Marital status: Single  ?  Spouse name: Not on file  ? Number of children: Not on file  ? Years of education: Not on file  ? Highest education level: Not on file  ?Occupational History  ? Not on file  ?Tobacco Use  ? Smoking status:  Former  ?  Packs/day: 0.25  ?  Types: Cigarettes  ? Smokeless tobacco: Never  ?Vaping Use  ? Vaping Use: Every day  ? Devices: "I vape nicotine, delta 8 and marijuana"  ?Substance and Sexual Activity  ? Alcohol use: Yes  ?  Alcohol/week: 6.0 standard drinks  ?  Types: 6 Shots of liquor per week  ?  Comment: "I take 5-6 shots about 5-7 days / week"  ? Drug use: Yes  ?  Types: Marijuana  ? Sexual activity: Yes  ?  Birth control/protection: None  ?Other Topics Concern  ? Not on file  ?Social History Narrative  ? Single and currently lives with paternal grandparents.  ? ?Social Determinants of Health  ? ?Financial Resource Strain: Not on file  ?Food Insecurity: Not on file  ?Transportation Needs: Not on file  ?Physical Activity: Not on file  ?Stress: Not on file  ?Social Connections: Not on file  ? ?Additional Social History:  ? - Currently on poor terms with grandmother after argument over phone this weekend ?  ?  ?  ? ?Sleep: Fair ? ?Appetite:  Fair ? ?Current Medications: ?Current Facility-Administered Medications  ?Medication Dose Route Frequency Provider Last Rate Last Admin  ? ARIPiprazole (ABILIFY) tablet 5 mg  5 mg Oral Q2000 Damita Dunnings B, MD   5 mg at 11/12/21 2100  ? [START ON 11/14/2021] FLUoxetine (PROZAC) capsule 20 mg  20 mg Oral Daily Shaan Rhoads, MD      ? gabapentin (NEURONTIN) capsule 100 mg  100 mg Oral TID Damita Dunnings B, MD   100 mg at 11/13/21 1157  ? hydrOXYzine (ATARAX) tablet 25 mg  25 mg Oral TID PRN Janine Limbo, MD   25 mg at 11/12/21 2116  ? ibuprofen (ADVIL) tablet 400 mg  400 mg Oral Q6H PRN Maida Sale, MD   400 mg at 11/12/21 2115  ? LORazepam (ATIVAN) tablet 1 mg  1 mg Oral BID Damita Dunnings B, MD   1 mg at 11/13/21 [id number]  ? Followed by  ? Derrill Memo ON 11/14/2021] LORazepam (ATIVAN) tablet 1 mg  1 mg Oral Daily McQuilla, Jai B, MD      ? multivitamin with minerals tablet 1 tablet  1 tablet Oral Daily Freida Busman, MD   1 tablet at 11/13/21 [id number]  ?  neomycin-bacitracin-polymyxin (NEOSPORIN) ointment   Topical Daily Freida Busman, MD   Given at 11/12/21 [id number]  ? nicotine polacrilex (NICORETTE) gum 2 mg  2 mg Oral PRN Damita Dunnings B, MD      ? thiamine tablet 100 mg  100 mg Oral Daily Damita Dunnings B, MD   100 mg at 11/13/21 [id number]  ? topiramate (TOPAMAX) tablet 50 mg  50 mg Oral Daily Ntuen, Tina C, FNP   50 mg at 11/13/21 [id number]  ? traZODone (DESYREL) tablet 50 mg  50 mg Oral QHS PRN Isidora Laham, MD      ? ? ?Lab Results: No results found for this or any previous visit (from the past 48 hour(s)). ? ?Blood Alcohol level:  ?  Lab Results  ?Component Value Date  ? ETH <10 11/09/2021  ? ETH <11 06/28/2013  ? ? ?Metabolic Disorder Labs: ?Lab Results  ?Component Value Date  ? HGBA1C 4.9 11/10/2021  ? MPG 93.93 11/10/2021  ? ?Lab Results  ?Component Value Date  ? PROLACTIN 25.6 07/11/2014  ? PROLACTIN 32.8 07/06/2014  ? ?Lab Results  ?Component Value Date  ? CHOL 138 11/10/2021  ? TRIG 55 11/10/2021  ? HDL 72 11/10/2021  ? CHOLHDL 1.9 11/10/2021  ? VLDL 11 11/10/2021  ? [place] 55 11/10/2021  ? [place] 64 07/06/2014  ? ? ?Physical Findings: ?AIMS: Facial and Oral Movements ?Muscles of Facial Expression: None, normal ?Lips and Perioral Area: None, normal ?Jaw: None, normal ?Tongue: None, normal,Extremity Movements ?Upper (arms, wrists, hands, fingers): None, normal ?Lower (legs, knees, ankles, toes): None, normal, Trunk Movements ?Neck, shoulders, hips: None, normal, Overall Severity ?Severity of abnormal movements (highest score from questions above): None, normal ?Incapacitation due to abnormal movements: None, normal ?Patient's awareness of abnormal movements (rate only patient's report): No Awareness, Dental Status ?Current problems with teeth and/or dentures?: No ?Does patient usually wear dentures?: No  ?CIWA:  CIWA-Ar Total: 5 ?COWS:    ? ?Musculoskeletal: ?Strength & Muscle Tone: within normal limits ?Gait & Station: normal ?Patient leans: N/A ? ?Psychiatric  Specialty Exam: ? ?Presentation  ?General Appearance: Casual ? ?Eye Contact:Good ? ?Speech:Normal Rate ? ?Speech Volume:Normal ? ?Handedness:Right ? ? ?Mood and Affect  ?Mood:Dysphoric ? ?Affect:Full Range ? ? ?Thought

## 2021-11-14 DIAGNOSIS — F603 Borderline personality disorder: Secondary | ICD-10-CM

## 2021-11-14 LAB — RESP PANEL BY RT-PCR (FLU A&B, COVID) ARPGX2
Influenza A by PCR: NEGATIVE
Influenza B by PCR: NEGATIVE
SARS Coronavirus 2 by RT PCR: NEGATIVE

## 2021-11-14 MED ORDER — ONDANSETRON 4 MG PO TBDP
4.0000 mg | ORAL_TABLET | Freq: Three times a day (TID) | ORAL | Status: DC | PRN
  Administered 2021-11-14: 4 mg via ORAL
  Filled 2021-11-14: qty 1

## 2021-11-14 MED ORDER — WHITE PETROLATUM EX OINT
TOPICAL_OINTMENT | CUTANEOUS | Status: AC
  Filled 2021-11-14: qty 5

## 2021-11-14 NOTE — Progress Notes (Signed)
RT PCR- Flu A&B, Covid sent to lab. ?

## 2021-11-14 NOTE — Progress Notes (Signed)
[facility] ? ?11/14/2021 10:46 AM ?Ariana CrazeCierra Burns  ?MRN:  [id number] ? ?Subjective: ?Ariana Burns is a 26 year old patient who uses they/them pronouns, with a past psychiatric history of major depressive disorder and anxiety who [facility] after endorsing suicidal ideation with a plan to harm themself. ?  ?On admission on 4/21, the following diagnoses were provided: ?- MDD (major depressive disorder), recurrent episode, severe (HCC) ?- Social anxiety disorder ?- PTSD (post-traumatic stress disorder) ?- Alcohol use disorder ?- Sedative, hypnotic or anxiolytic use disorder, mild, abuse (HCC) ?- Cannabis use disorder, severe, dependence (HCC) ? ?Yesterday, the psychiatry team made the following recommendations: ?- CIWA with scheduled Ativan taper ?- Increase Prozac to 20mg  ?- Continue Abilify 5mg  QHS ?- ContinueTopamax 50 mg PO daily  ? ? ?On examination today, the patient reports feeling less depressed. They note that they had some "ups and downs" in the last 24 hours, but report that their mood is less depressed this morning. The patient has been eating 3 meals a day and generally finishing most of them if the food tastes good. The patient denies restricting food intake intentionally. The patient has been sleeping most of the night and falling asleep quickly. They report having more energy than before. The patient denies suicidal ideation and homicidal ideation and has not had any auditory or visual hallucinations since the withdrawal symptoms experienced this weekend. Last night, the patient got anxious and overwhelmed after taking their evening dose of trazodone because they previously had a severe withdrawal years ago from a combination of Effexor and trazodone. The patient associates the trazodone with the withdrawal symptoms and has asked for it to be discontinued. The patient notes an increased sense of deja vu but is unsure why. Their nausea has been low and improved  since the withdrawal episode this past weekend.  ? ? ?Principal Problem: MDD (major depressive disorder), recurrent episode, severe (HCC) ?Diagnosis: Principal Problem: ?  MDD (major depressive disorder), recurrent episode, severe (HCC) ?Active Problems: ?  Social anxiety disorder ?  PTSD (post-traumatic stress disorder) ?  Alcohol use disorder ?  Sedative, hypnotic or anxiolytic use disorder, mild, abuse (HCC) ?  Cannabis use disorder, severe, dependence (HCC) ?  Borderline personality disorder (HCC) ? ?Total Time spent with patient: 20 minutes ? ?Past Psychiatric History: [facility] for diagnosis major depressive disorder ? ?Past Medical History:  ?Past Medical History:  ?Diagnosis Date  ? Anxiety   ? Depression   ? Medical history non-contributory   ? History reviewed. No pertinent surgical history. ?Family History:  ?Family History  ?Problem Relation Age of Onset  ? Hypertension Maternal Grandfather   ? Depression Mother   ? Depression Father   ? ?Family Psychiatric  History: [facility] and has had a suicide attempt. ? ?Social History:  ?Social History  ? ?Substance and Sexual Activity  ?Alcohol Use Yes  ? Alcohol/week: 6.0 standard drinks  ? Types: 6 Shots of liquor per week  ? Comment: "I take 5-6 shots about 5-7 days / week"  ?   ?Social History  ? ?Substance and Sexual Activity  ?Drug Use Yes  ? Types: Marijuana  ?  ?Social History  ? ?Socioeconomic History  ? Marital status: Single  ?  Spouse name: Not on file  ? Number of children: Not on file  ? Years of education: Not on file  ?  Highest education level: Not on file  ?Occupational History  ? Not on file  ?Tobacco Use  ? Smoking status: Former  ?  Packs/day: 0.25  ?  Types: Cigarettes  ? Smokeless tobacco: Never  ?Vaping Use  ? Vaping Use: Every day  ? Devices: "I vape nicotine, delta 8 and marijuana"  ?Substance and Sexual Activity  ? Alcohol use: Yes  ?  Alcohol/week: 6.0 standard  drinks  ?  Types: 6 Shots of liquor per week  ?  Comment: "I take 5-6 shots about 5-7 days / week"  ? Drug use: Yes  ?  Types: Marijuana  ? Sexual activity: Yes  ?  Birth control/protection: None  ?Other Topics Concern  ? Not on file  ?Social History Narrative  ? Single and currently lives with paternal grandparents.  ? ?Social Determinants of Health  ? ?Financial Resource Strain: Not on file  ?Food Insecurity: Not on file  ?Transportation Needs: Not on file  ?Physical Activity: Not on file  ?Stress: Not on file  ?Social Connections: Not on file  ? ?Additional Social History:  ?  - Currently on poor terms with grandmother after saying they will not be staying at grandmother's house after discharge ?  ?Sleep: Fair, falling asleep quickly, waking up 2-3 times at night, but sleeping 6-7 hours total ? ?Appetite:  Fair, eating most meals depending on taste, not withholding food ? ?Current Medications: ?Current Facility-Administered Medications  ?Medication Dose Route Frequency Provider Last Rate Last Admin  ? ARIPiprazole (ABILIFY) tablet 5 mg  5 mg Oral Q2000 Eliseo Gum B, MD   5 mg at 11/13/21 2100  ? FLUoxetine (PROZAC) capsule 20 mg  20 mg Oral Daily Lovetta Condie, MD   20 mg at 11/14/21 7939  ? gabapentin (NEURONTIN) capsule 100 mg  100 mg Oral TID Eliseo Gum B, MD   100 mg at 11/14/21 0300  ? hydrOXYzine (ATARAX) tablet 25 mg  25 mg Oral TID PRN Phineas Inches, MD   25 mg at 11/13/21 2134  ? ibuprofen (ADVIL) tablet 400 mg  400 mg Oral Q6H PRN Roselle Locus, MD   400 mg at 11/12/21 2115  ? multivitamin with minerals tablet 1 tablet  1 tablet Oral Daily Eliseo Gum B, MD   1 tablet at 11/14/21 9233  ? neomycin-bacitracin-polymyxin (NEOSPORIN) ointment   Topical Daily Bobbye Morton, MD   Given at 11/12/21 0076  ? nicotine polacrilex (NICORETTE) gum 2 mg  2 mg Oral PRN Eliseo Gum B, MD      ? thiamine tablet 100 mg  100 mg Oral Daily Eliseo Gum B, MD   100 mg at 11/14/21 2263  ?  topiramate (TOPAMAX) tablet 50 mg  50 mg Oral Daily Ntuen, Tina C, FNP   50 mg at 11/14/21 3354  ? ? ?Lab Results:  ?Results for orders placed or performed during the hospital encounter of 11/10/21 (from the past 48 hour(s))  ?Basic metabolic panel     Status: Abnormal  ? Collection Time: 11/13/21  6:12 PM  ?Result Value Ref Range  ? Sodium 137 135 - 145 mmol/L  ? Potassium 3.7 3.5 - 5.1 mmol/L  ? Chloride 104 98 - 111 mmol/L  ? CO2 25 22 - 32 mmol/L  ? Glucose, Bld 106 (H) 70 - 99 mg/dL  ?  Comment: Glucose reference range applies only to samples taken after fasting for at least 8 hours.  ? BUN 14 6 - 20 mg/dL  ? Creatinine,  Ser 0.95 0.44 - 1.00 mg/dL  ? Calcium 9.3 8.9 - 10.3 mg/dL  ? GFR, Estimated >60 >60 mL/min  ?  Comment: (NOTE) ?Calculated using the CKD-EPI Creatinine Equation (2021) ?  ? Anion gap 8 5 - 15  ?  Comment: Performed at [facility], [address], [place], [place] [address]  ? ? ?Blood Alcohol level:  ?Lab Results  ?Component Value Date  ? ETH <10 11/09/2021  ? ETH <11 06/28/2013  ? ? ?Metabolic Disorder Labs: ?Lab Results  ?Component Value Date  ? HGBA1C 4.9 11/10/2021  ? MPG 93.93 11/10/2021  ? ?Lab Results  ?Component Value Date  ? PROLACTIN 25.6 07/11/2014  ? PROLACTIN 32.8 07/06/2014  ? ?Lab Results  ?Component Value Date  ? CHOL 138 11/10/2021  ? TRIG 55 11/10/2021  ? HDL 72 11/10/2021  ? CHOLHDL 1.9 11/10/2021  ? VLDL 11 11/10/2021  ? LDLCALC 55 11/10/2021  ? LDLCALC 64 07/06/2014  ? ? ?Physical Findings: ?AIMS: Facial and Oral Movements ?Muscles of Facial Expression: None, normal ?Lips and Perioral Area: None, normal ?Jaw: None, normal ?Tongue: None, normal,Extremity Movements ?Upper (arms, wrists, hands, fingers): None, normal ?Lower (legs, knees, ankles, toes): None, normal, Trunk Movements ?Neck, shoulders, hips: None, normal, Overall Severity ?Severity of abnormal movements (highest score from questions above): None, normal ?Incapacitation due to abnormal  movements: None, normal ?Patient's awareness of abnormal movements (rate only patient's report): No Awareness, Dental Status ?Current problems with teeth and/or dentures?: No ?Does patient usually wear dentures?: No

## 2021-11-14 NOTE — BHH Group Notes (Signed)
Pt attended group and participated in discussion. 

## 2021-11-14 NOTE — Group Note (Signed)
Recreation Therapy Group Note ? ? ?Group Topic:Animal Assisted Therapy   ?Group Date: 11/14/2021 ?Start Time: 1430 ?End Time: [id number] ?Facilitators: Victorino Sparrow, LRT,CTRS ?Location: [place] ? ? ?Animal-Assisted Activity (AAA) Program Checklist/Progress Note ?Patient Eligibility Criteria Checklist & Daily Group note for Rec Tx Intervention ? ?AAA/T Program Assumption of Risk Form signed by Patient/ or Parent Legal Guardian YES ? ?Patient is free of allergies or severe asthma  YES ? ?Patient reports no fear of animals YES ? ?Patient reports no history of cruelty to animals YES ? ?Patient understands their participation is voluntary YES ? ?Patient washes hands before animal contact YES ? ?Patient washes hands after animal contact YES ? ? ?Group Description: Patients provided opportunity to interact with trained and credentialed [employer] [facility] dog and the community volunteer/dog handler. Patients practiced appropriate animal interaction and were educated on dog safety outside of the hospital in common community settings. Patients were allowed to use dog toys and other items to practice commands, engage the dog in play, and/or complete routine aspects of animal care.  ? ?Education: Contractor, Pensions consultant, Communication & Social Skills  ? ? ?Affect/Mood: Appropriate ?  ?Participation Level: Engaged ?  ? ?Clinical Observations/Individualized Feedback: Pt attended and participated in group session.  ?  ? ?Plan: Continue to engage patient in RT group sessions 2-3x/week. ? ? ?Victorino Sparrow, LRT,CTRS ?11/14/2021 4:16 PM ?

## 2021-11-14 NOTE — Progress Notes (Signed)
Pt denies SI/HI/AVH and verbally agrees to approach staff if these become apparent or before harming themselves/others. Rates depression 0/10. Rates anxiety 2/10. Rates pain 0/10.  Pt complained of chills and nausea around dinner time. MD notified and PRN was placed as well as covid/flu test. Pt has had no other symptoms. Pt asked for the withdrawal medication, referring to the ativan. Pt was informed they would not be getting that medication anymore. Scheduled medications administered to pt, per MD orders. RN provided support and encouragement to pt. Q15 min safety checks implemented and continued. Pt safe on the unit. RN will continue to monitor and intervene as needed.  ? 11/14/21 0742  ?Psych Admission Type (Psych Patients Only)  ?Admission Status Voluntary  ?Psychosocial Assessment  ?Patient Complaints Anxiety  ?Eye Contact Fair  ?Facial Expression Animated  ?Affect Appropriate to circumstance  ?Speech Logical/coherent  ?Interaction Assertive  ?Motor Activity Other (Comment) ?(WDL)  ?Appearance/Hygiene Unremarkable  ?Behavior Characteristics Cooperative;Appropriate to situation;Anxious  ?Mood Anxious;Pleasant  ?Thought Process  ?Coherency WDL  ?Content WDL  ?Delusions None reported or observed  ?Perception WDL  ?Hallucination None reported or observed  ?Judgment Impaired  ?Confusion None  ?Danger to Self  ?Current suicidal ideation? Denies  ?Danger to Others  ?Danger to Others None reported or observed  ? ? ?

## 2021-11-15 DIAGNOSIS — F332 Major depressive disorder, recurrent severe without psychotic features: Secondary | ICD-10-CM

## 2021-11-15 MED ORDER — ARIPIPRAZOLE 5 MG PO TABS: 5.0000 mg | ORAL_TABLET | Freq: Every day | ORAL | 0 refills | Status: AC

## 2021-11-15 MED ORDER — FLUOXETINE HCL 20 MG PO CAPS
20.0000 mg | ORAL_CAPSULE | Freq: Every day | ORAL | 0 refills | Status: AC
Start: 2021-11-16 — End: 2021-12-16

## 2021-11-15 MED ORDER — NICOTINE POLACRILEX 2 MG MT GUM: 2.0000 mg | CHEWING_GUM | OROMUCOSAL | 0 refills | Status: AC | PRN

## 2021-11-15 MED ORDER — BACITRACIN-NEOMYCIN-POLYMYXIN OINTMENT TUBE: 1.0000 "application " | TOPICAL_OINTMENT | Freq: Every day | CUTANEOUS | Status: DC

## 2021-11-15 MED ORDER — HYDROXYZINE HCL 25 MG PO TABS: 25.0000 mg | ORAL_TABLET | Freq: Three times a day (TID) | ORAL | 0 refills | Status: AC | PRN

## 2021-11-15 MED ORDER — GABAPENTIN 100 MG PO CAPS: 100.0000 mg | ORAL_CAPSULE | Freq: Three times a day (TID) | ORAL | 0 refills | Status: AC

## 2021-11-15 MED ORDER — TOPIRAMATE 50 MG PO TABS: 50.0000 mg | ORAL_TABLET | Freq: Every day | ORAL | 0 refills | Status: AC
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
## 2021-11-15 NOTE — Progress Notes (Signed)
?   11/15/21 0750  ?Psych Admission Type (Psych Patients Only)  ?Admission Status Voluntary  ?Psychosocial Assessment  ?Patient Complaints None  ?Eye Contact Other (Comment) ?(WDL)  ?Facial Expression Wide-eyed  ?Affect Appropriate to circumstance  ?Speech Logical/coherent  ?Interaction Other (Comment) ?(WDL)  ?Appearance/Hygiene Unremarkable  ?Behavior Characteristics Cooperative;Appropriate to situation;Calm  ?Mood Pleasant  ?Thought Process  ?Coherency WDL  ?Content WDL  ?Delusions None reported or observed  ?Perception WDL  ?Hallucination None reported or observed  ?Judgment WDL  ?Confusion None  ?Danger to Self  ?Current suicidal ideation? Denies  ?Self-Injurious Behavior No self-injurious ideation or behavior indicators observed or expressed   ?Agreement Not to Harm Self Yes  ?Description of Agreement Verbal  ?Danger to Others  ?Danger to Others None reported or observed  ? ? ?

## 2021-11-15 NOTE — Discharge Summary (Signed)
Physician Discharge Summary Note ? ?Patient:  Ariana Burns is an 26 y.o., adult ?MRN:  [id number] ?DOB:  11-Jul-1996 ?Patient phone:  [phone] (home)  ?Patient address:   ?[address] ?[place] [address],  ?Total Time spent with patient: 20 minutes ? ?Date of Admission:  11/10/2021 ?Date of Discharge: 11-15-2021 ? ?Reason for Admission:   ?Ariana Burns is a 74 yo patient who uses they/ them pronouns, with a PPH of MDD and anxiety who was transferred from [facility] to [facility] after endorsing SI with a plan. ? ?Principal Problem: MDD (major depressive disorder), recurrent episode, severe (HCC) ?Discharge Diagnoses: Principal Problem: ?  MDD (major depressive disorder), recurrent episode, severe (HCC) ?Active Problems: ?  Social anxiety disorder ?  PTSD (post-traumatic stress disorder) ?  Alcohol use disorder ?  Sedative, hypnotic or anxiolytic use disorder, mild, abuse (HCC) ?  Cannabis use disorder, severe, dependence (HCC) ?  Borderline personality disorder (HCC) ? ? ?Past Psychiatric History:  ?2 prior hospitalizations at [facility] C/1 in 06/2013 and 06/2014 for diagnosis major depressive disorder ? ?Past Medical History:  ?Past Medical History:  ?Diagnosis Date  ? Anxiety   ? Depression   ? Medical history non-contributory   ? History reviewed. No pertinent surgical history. ?Family History:  ?Family History  ?Problem Relation Age of Onset  ? Hypertension Maternal Grandfather   ? Depression Mother   ? Depression Father   ? ?Family Psychiatric  History:  ?Mom has been hospitalized at [facility] and has had a suicide attempt. ? ? ?Social History:  ?Social History  ? ?Substance and Sexual Activity  ?Alcohol Use Yes  ? Alcohol/week: 6.0 standard drinks  ? Types: 6 Shots of liquor per week  ? Comment: "I take 5-6 shots about 5-7 days / week"  ?   ?Social History  ? ?Substance and Sexual Activity  ?Drug Use Yes  ? Types: Marijuana  ?  ?Social History  ? ?Socioeconomic History  ? Marital status: Single  ?   Spouse name: Not on file  ? Number of children: Not on file  ? Years of education: Not on file  ? Highest education level: Not on file  ?Occupational History  ? Not on file  ?Tobacco Use  ? Smoking status: Former  ?  Packs/day: 0.25  ?  Types: Cigarettes  ? Smokeless tobacco: Never  ?Vaping Use  ? Vaping Use: Every day  ? Devices: "I vape nicotine, delta 8 and marijuana"  ?Substance and Sexual Activity  ? Alcohol use: Yes  ?  Alcohol/week: 6.0 standard drinks  ?  Types: 6 Shots of liquor per week  ?  Comment: "I take 5-6 shots about 5-7 days / week"  ? Drug use: Yes  ?  Types: Marijuana  ? Sexual activity: Yes  ?  Birth control/protection: None  ?Other Topics Concern  ? Not on file  ?Social History Narrative  ? Single and currently lives with paternal grandparents.  ? ?Social Determinants of Health  ? ?Financial Resource Strain: Not on file  ?Food Insecurity: Not on file  ?Transportation Needs: Not on file  ?Physical Activity: Not on file  ?Stress: Not on file  ?Social Connections: Not on file  ? ? ?Hospital Course:   ?During the patient's hospitalization, patient had extensive initial psychiatric evaluation, and follow-up psychiatric evaluations every day. ?  ?Psychiatric diagnoses provided upon initial assessment:  ?MDD, recurrent, severe w/ psychosis ?Social anxiety disorder ?PTSD ?Binge eating disorder ?Concern for possible Anorexia Nervosa Hx ?  Cocaine use ?Etoh use disorder, severe ?Sedative use disorder, moderate ?Borderline personality disorder ?  ?Patient's psychiatric medications were adjusted on admission:  ?- Start Prozac , titrate up as tolerated for depression and anxiety and PTSD ?- Start Abilify  QHS, adjunct for tx of depression and mood stabilization. ?- CIWA with scheduled Ativan taper ?  ?During the hospitalization, other adjustments were made to the patient's psychiatric medication regimen:  ?-prozac was increased to 20 mg once daily  ?  ?Gradually, patient started adjusting to milieu.    ?Patient's care was discussed during the interdisciplinary team meeting every day during the hospitalization. ?  ?The patient reported having some sedation, but otherwise denies having side effects to prescribed psychiatric medication. ?  ?The patient reports their target psychiatric symptoms of depression, mood lability, and suicidal thoughts, all responded well to the psychiatric medications, and the patient reports overall benefit other psychiatric hospitalization. Supportive psychotherapy was provided to the patient. The patient also participated in regular group therapy while admitted.  ?  ?Labs were reviewed with the patient, and abnormal results were discussed with the patient. ?  ?The patient denied having suicidal thoughts more than 48 hours prior to discharge.  Patient denies having homicidal thoughts.  Patient denies having auditory hallucinations.  Patient denies any visual hallucinations.  Patient denies having paranoid thoughts. ?  ?The patient is able to verbalize their individual safety plan to this provider. ?  ?It is recommended to the patient to continue psychiatric medications as prescribed, after discharge from the hospital.   ?  ?It is recommended to the patient to follow up with your outpatient psychiatric provider and PCP. ?  ?Discussed with the patient, the impact of alcohol, drugs, tobacco have been there overall psychiatric and medical wellbeing, and total abstinence from substance use was recommended the patient. ?  ?  ?  ?  ? ?Physical Findings: ?AIMS: Facial and Oral Movements ?Muscles of Facial Expression: None, normal ?Lips and Perioral Area: None, normal ?Jaw: None, normal ?Tongue: None, normal,Extremity Movements ?Upper (arms, wrists, hands, fingers): None, normal ?Lower (legs, knees, ankles, toes): None, normal, Trunk Movements ?Neck, shoulders, hips: None, normal, Overall Severity ?Severity of abnormal movements (highest score from questions above): None, normal ?Incapacitation  due to abnormal movements: None, normal ?Patient's awareness of abnormal movements (rate only patient's report): No Awareness, Dental Status ?Current problems with teeth and/or dentures?: No ?Does patient usually wear dentures?: No  ?CIWA:  CIWA-Ar Total: 0 ?COWS:    ? ?Musculoskeletal: ?Strength & Muscle Tone: within normal limits ?Gait & Station: normal ?Patient leans: N/A ? ? ?Psychiatric Specialty Exam: ? ?Presentation  ?General Appearance: Appropriate for Environment; Casual ? ?Eye Contact:Good ? ?Speech:Normal Rate ? ?Speech Volume:Normal ? ?Handedness:Right ? ? ?Mood and Affect  ?Mood:Euthymic ? ?Affect:Appropriate; Congruent; Full Range ? ? ?Thought Process  ?Thought Processes:Linear ? ?Descriptions of Associations:Intact ? ?Orientation:Full (Time, Place and Person) ? ?Thought Content:Logical ? ?History of Schizophrenia/Schizoaffective disorder:No ? ?Duration of Psychotic Symptoms:N/A ? ?Hallucinations:Hallucinations: None ? ?Ideas of Reference:None ? ?Suicidal Thoughts:Suicidal Thoughts: No ? ?Homicidal Thoughts:Homicidal Thoughts: No ? ? ?Sensorium  ?Memory:Immediate Good; Recent Good; Remote Good ? ?Judgment:Fair ? ?Insight:Fair ? ? ?Executive Functions  ?Concentration:Fair ? ?Attention Span:Fair ? ?Recall:Good ? ?Fund of Knowledge:Good ? ?Language:Good ? ? ?Psychomotor Activity  ?Psychomotor Activity:Psychomotor Activity: Normal ? ? ?Assets  ?Assets:Communication Skills; Physical Health ? ? ?Sleep  ?Sleep:Sleep: Fair ? ? ? ?Physical Exam: ?Physical Exam ?Vitals reviewed.  ?Constitutional:   ?   General: Ariana Loyola "[name]"  is not in acute distress. ?   Appearance: Ariana Ensz "[name]" is not toxic-appearing.  ?Pulmonary:  ?   Effort: Pulmonary effort is normal.  ?Neurological:  ?   Mental Status: Ariana Josephson "[name]" is alert.  ?   Motor: No weakness.  ?   Gait: Gait normal.  ?Psychiatric:     ?   Mood and Affect: Mood normal.     ?   Behavior: Behavior normal.     ?   Thought Content: Thought content  normal.     ?   Judgment: Judgment normal.  ? ?Review of Systems  ?Constitutional:  Negative for chills and fever.  ?Cardiovascular:  Negative for chest pain and palpitations.  ?Neurological:  Negative for

## 2021-11-15 NOTE — Progress Notes (Signed)
Patient did not attend wrap up group. 

## 2021-11-15 NOTE — Progress Notes (Signed)
Patient discharged. Received all personal belongings. Patient received provided medications. Discharge instructions reviewed with patient. Patient verbalized understanding. Patient at discharge denies SI/HI/AVH. Patient left unit at 1242.  ?

## 2021-11-15 NOTE — Group Note (Signed)
Recreation Therapy Group Note ? ? ?Group Topic:Stress Management  ?Group Date: 11/15/2021 ?Start Time: 42 ?End Time: 0955 ?Facilitators: Caroll Rancher, LRT,CTRS ?Location: [address] Dayroom ? ? ?Goal Area(s) Addresses:  ?Patient will identify positive stress management techniques. ?Patient will identify benefits of using stress management post d/c. ? ?Group Description:  Meditation.  LRT played a meditation that focused on setting intention for the day.  It also focused on making the best use for your time and not focusing on things from the previous day but to focus on what you can in the now.  Patients were to listen and follow along as meditation played to fully engage.  Patients also learned they could find other stress management techniques from Youtube, apps and the Internet in general. ? ? ?Affect/Mood: Appropriate ?  ?Participation Level: Engaged ?  ?Participation Quality: Independent ?  ?Behavior: Appropriate ?  ?Speech/Thought Process: Focused ?  ?Insight: Good ?  ?Judgement: Good ?  ?Modes of Intervention: Meditation ?  ?Patient Response to Interventions:  Engaged ?  ?Education Outcome: ? Acknowledges education and In group clarification offered   ? ?Clinical Observations/Individualized Feedback: Pt listened and engaged with meditation during group session.  ? ? ?Plan: Continue to engage patient in RT group sessions 2-3x/week. ? ? ?Caroll Rancher, LRT,CTRS ?11/15/2021 12:14 PM ?

## 2021-11-15 NOTE — Progress Notes (Signed)
?  [facility] Adult Case Management Discharge Plan : ? ?Will you be returning to the same living situation after discharge:  No. Friend's Home  ?At discharge, do you have transportation home?: Yes,  Girlfriend  ?Do you have the ability to pay for your medications: Yes,  Community Support and Employment  ? ?Release of information consent forms completed and in the chart;  Patient's signature needed at discharge. ? ?Patient to Follow up at: ? Follow-up Information   ? ? [facility]. Go to.   ?Specialty: Behavioral Health ?Why: Please go to this provider for therapy and medication management services on walk in days:  Monday and Wednesday, arrive at 7:30 am.  Services are provided on a first come, first served basis. ?Contact information: ?[address] ?[place] [address] ?[phone] ? ?  ?  ? ? [facility] Follow up.   ?Why: You may contact this agency to inquire about DBT groups. ?Contact information: ?Address: [address], [place], [place] [address] ? ?Phone: [phone] ?Fax: [phone] ? ?  ?  ? ?  ?  ? ?  ? ? ?Next level of care provider has access to [facility] Link:yes ? ?Safety Planning and Suicide Prevention discussed: Yes,  with patient and girlfriend  ? ?  ? ?Has patient been referred to the Quitline?: Patient refused referral ? ?Patient has been referred for addiction treatment: Pt. refused referral ? ?Aram Beecham, LCSWA ?11/15/2021, 9:11 AM ?

## 2022-01-23 ENCOUNTER — Other Ambulatory Visit: Payer: Self-pay

## 2022-01-23 ENCOUNTER — Encounter (HOSPITAL_COMMUNITY): Payer: Self-pay

## 2022-01-23 ENCOUNTER — Emergency Department (HOSPITAL_COMMUNITY)
Admission: EM | Admit: 2022-01-23 | Discharge: 2022-01-23 | Disposition: A | Discharge status: Home / Self Care | Payer: Medicaid Other | Attending: Emergency Medicine | Admitting: Emergency Medicine

## 2022-01-23 DIAGNOSIS — F16121 Hallucinogen abuse with intoxication with delirium: Secondary | ICD-10-CM | POA: Insufficient documentation

## 2022-01-23 DIAGNOSIS — F16921 Hallucinogen use, unspecified with intoxication with delirium: Secondary | ICD-10-CM

## 2022-01-23 DIAGNOSIS — F419 Anxiety disorder, unspecified: Secondary | ICD-10-CM | POA: Insufficient documentation
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
## 2022-01-23 NOTE — Discharge Instructions (Signed)
Contact a health care provider if: Symptoms do not get better or they become worse. New symptoms of delirium develop. Caring for the person at home does not seem safe. Eating, drinking, or communicating stops. There are side effects of medicines, such as changes in sleep patterns, dizziness, weight gain, restlessness, movement changes, or tremors.
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
## 2022-01-23 NOTE — ED Notes (Signed)
Declines dc VS.

## 2022-01-23 NOTE — ED Notes (Signed)
Declines blood work, Publishing copy, and cbg. Requesting to leave.

## 2022-02-02 IMAGING — DX DG CHEST 2V
2 series · 2 of 2 positions shown · non-contrast
Comparison: None.

CLINICAL DATA: Chest pain and shortness of breath

EXAM:
CHEST - 2 VIEW

[chest pa]
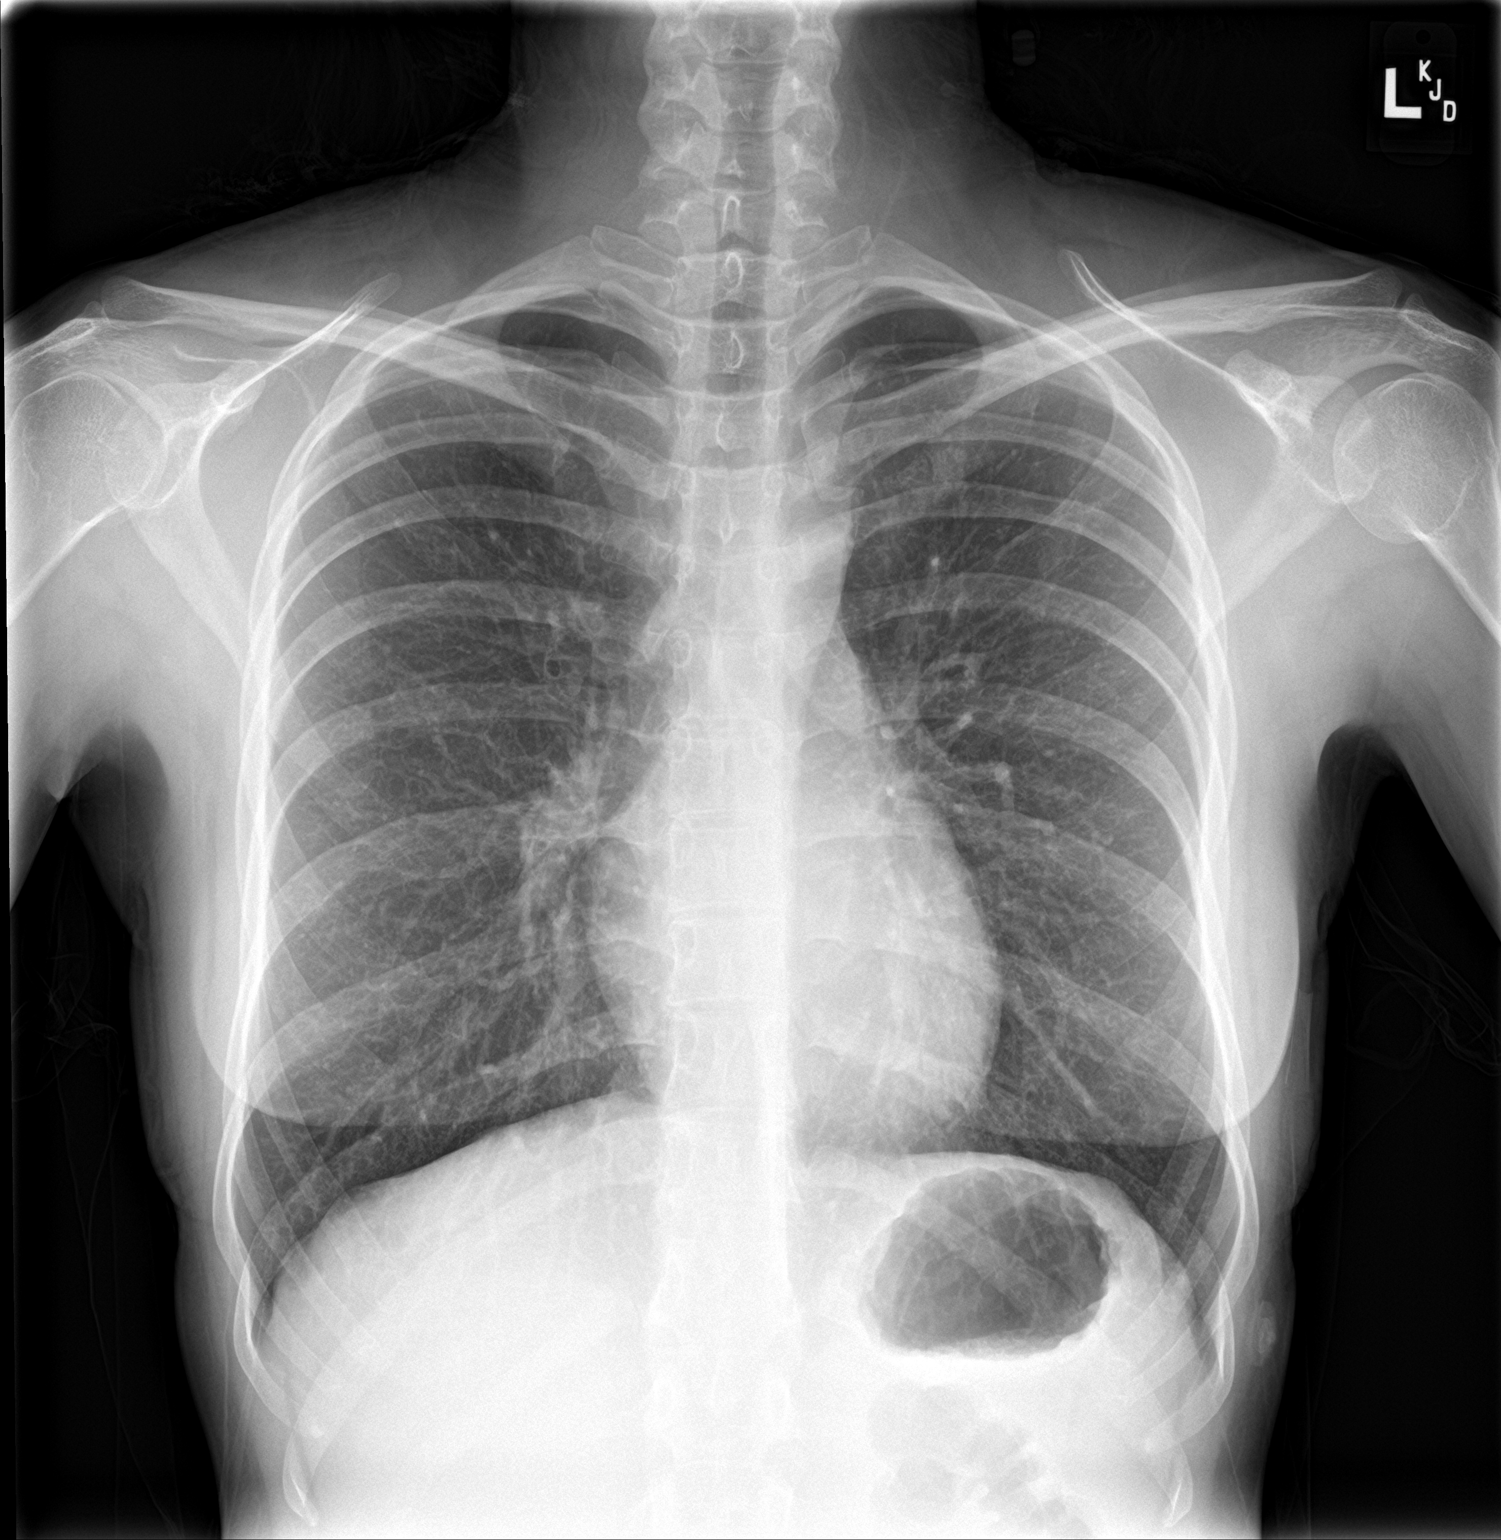

[chest lat]
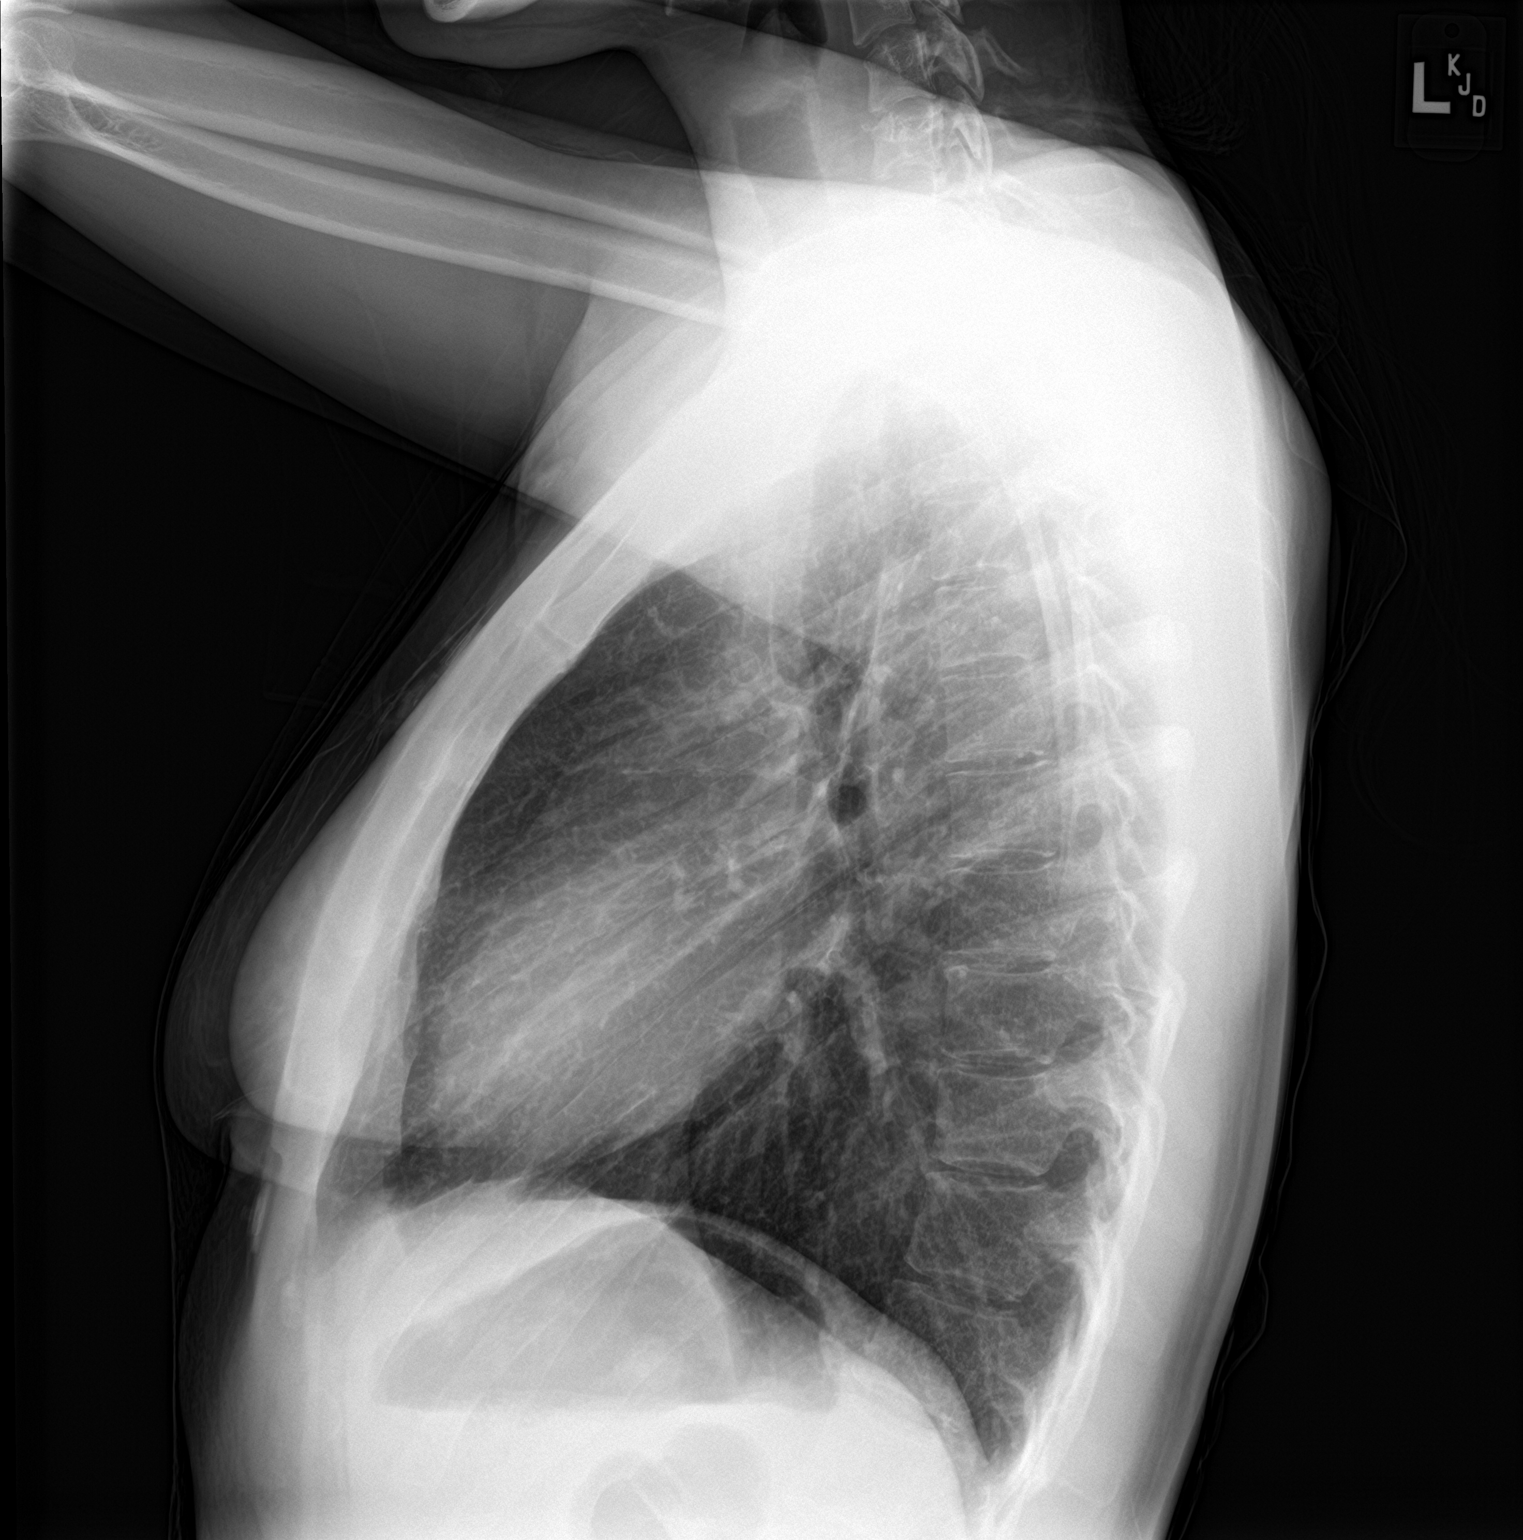

[2 of 2 positions shown; findings below may reference images not displayed]

FINDINGS: The heart size and mediastinal contours are within normal limits.
Both lungs are clear. The visualized skeletal structures are
unremarkable.
IMPRESSION: No active cardiopulmonary disease.

## 2023-09-14 ENCOUNTER — Ambulatory Visit: Payer: Medicaid Other

## 2024-05-24 ENCOUNTER — Other Ambulatory Visit: Payer: Self-pay

## 2024-05-24 ENCOUNTER — Ambulatory Visit
Admission: EM | Admit: 2024-05-24 | Discharge: 2024-05-24 | Disposition: A | Discharge status: Home / Self Care | Payer: Self-pay

## 2024-05-24 ENCOUNTER — Encounter: Payer: Self-pay | Admitting: *Deleted

## 2024-05-24 DIAGNOSIS — N23 Unspecified renal colic: Secondary | ICD-10-CM

## 2024-05-24 LAB — POCT URINE DIPSTICK
Bilirubin, UA: NEGATIVE
Glucose, UA: NEGATIVE mg/dL
Ketones, POC UA: NEGATIVE mg/dL
Leukocytes, UA: NEGATIVE
Nitrite, UA: NEGATIVE
Protein Ur, POC: NEGATIVE mg/dL
Spec Grav, UA: 1.025 (ref 1.010–1.025)
Urobilinogen, UA: 0.2 U/dL
pH, UA: 6.5 (ref 5.0–8.0)

## 2024-05-24 MED ORDER — DICLOFENAC SODIUM 75 MG PO TBEC: 75.0000 mg | DELAYED_RELEASE_TABLET | Freq: Two times a day (BID) | ORAL | 0 refills | Status: DC

## 2024-05-24 MED ORDER — TAMSULOSIN HCL 0.4 MG PO CAPS: 0.4000 mg | ORAL_CAPSULE | Freq: Every day | ORAL | 0 refills | Status: AC

## 2024-05-24 NOTE — Discharge Instructions (Addendum)
 Symptoms that you have today are concerning for kidney stone.  Will prescribe Flomax that will blood in the lumen of the twos between your kidney and your bladder so the kidney stone can pass.  I will give you a strainer as you can strain your urine.  Given about 7 days, you should see some sand pass for the strainer or experience pain relief.  You should also increase your water intake to half a gallon to a gallon of water a day and drink no sugary drinks or soda.  If you do not experience either of those things we need to call urology for further evaluation.

## 2024-05-24 NOTE — ED Triage Notes (Signed)
 Pt reports having left lower back pain also frequency of urination (states they drink a lot of water) x 3 weeks. States symptoms improved then worsened. Also reports dysuria sometimes. Denies known fever, but has had body aches and chills over the last 2 days. States they have constant dull pain with intermittent sharp shooting pain that is more intense. Tried taking cold medicine yesterday

## 2024-05-24 NOTE — ED Provider Notes (Signed)
 [facility]    CSN: [id number] Arrival date & time: 05/24/24  1227      History   Chief Complaint Chief Complaint  Patient presents with   Back Pain    HPI Ariana Burns is a 28 y.o. adult.   Patient presents today due to 3 weeks worth of left-sided lower back pain that occasionally radiates to the left lower quadrant.  Patient states that she experiences nausea and occasional body aches.  Patient states that she occasionally also has difficulty finding a comfortable position to sleep in.  Patient states that she feels as if there is swelling of her left lower back.  Patient denies dysuria.  Patient states that she has been drinking a lot water but also drinks a lot of sweet tea and sodas.  Patient states is that last menstrual period was about 3 weeks ago.  The history is provided by the patient.  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 No pertinent surgical history.  OB History     Gravida  0   Para      Term      Preterm      AB      Living         SAB      IAB      Ectopic      Multiple      Live Births               Home Medications    Prior to Admission medications   Medication Sig Start Date End Date Taking?  Authorizing Provider  diclofenac (VOLTAREN) 75 MG EC tablet Take 1 tablet (75 mg total) by mouth 2 (two) times daily. 05/24/24  Yes Andra Corean BROCKS, PA-C  tamsulosin (FLOMAX) 0.4 MG CAPS capsule Take 1 capsule (0.4 mg total) by mouth daily for 10 days. 05/24/24 06/03/24 Yes Andra Corean BROCKS, PA-C  albuterol  (VENTOLIN  HFA) 108 (90 Base) MCG/ACT inhaler Inhale 1-2 puffs into the lungs every 6 (six) hours as needed for wheezing or shortness of breath. Patient not taking: Reported on 05/24/2024 05/29/21   Billy Asberry FALCON, PA-C  ARIPiprazole  (ABILIFY ) 5 MG tablet Take 1 tablet (5 mg total) by mouth daily at 8 pm. Patient not taking: Reported on 05/24/2024 11/15/21 12/15/21  Johny Lot, MD  FLUoxetine  (PROZAC ) 20 MG capsule Take 1 capsule (20 mg total) by mouth daily. Patient not taking: Reported on 05/24/2024 11/16/21 12/16/21  Johny Lot, MD  gabapentin  (NEURONTIN ) 100 MG capsule Take 1 capsule (100 mg total) by mouth 3 (three) times daily. Patient not taking: Reported on 05/24/2024 11/15/21 12/15/21  Johny Lot, MD  neomycin -bacitracin -polymyxin (NEOSPORIN) OINT Apply 1 application. topically daily. Patient not taking: Reported on 05/24/2024 11/16/21   Johny Lot, MD  topiramate  (TOPAMAX ) 50 MG  tablet Take 1 tablet (50 mg total) by mouth daily. Patient not taking: Reported on 05/24/2024 11/16/21 12/16/21  Johny Lot, MD    Family History Family History  Problem Relation Age of Onset   Hypertension Maternal Grandfather    Depression Mother    Depression Father     Social History Social History   Tobacco Use   Smoking status: Former    Current packs/day: 0.25    Types: Cigarettes   Smokeless tobacco: Never  Vaping Use   Vaping status: Every Day   Devices: I vape nicotine , delta 8 and marijuana  Substance Use Topics   Alcohol use: Not Currently    Alcohol/week: 6.0 standard drinks of alcohol    Types: 6 Shots of liquor per week    Comment: I take  5-6 shots about 5-7 days / week   Drug use: Not Currently    Types: Marijuana     Allergies   Patient has no known allergies.   Review of Systems Review of Systems  Musculoskeletal:  Positive for back pain.     Physical Exam Triage Vital Signs ED Triage Vitals  Encounter Vitals Group     BP 05/24/24 1321 108/68     Girls Systolic BP Percentile --      Girls Diastolic BP Percentile --      Boys Systolic BP Percentile --      Boys Diastolic BP Percentile --      Pulse Rate 05/24/24 1321 69     Resp 05/24/24 1321 18     Temp 05/24/24 1321 98.3 F (36.8 C)     Temp Source 05/24/24 1321 Oral     SpO2 05/24/24 1321 98 %     Weight --      Height --      Head Circumference --      Peak Flow --      Pain Score 05/24/24 1315 5     Pain Loc --      Pain Education --      Exclude from Growth Chart --    No data found.  Updated Vital Signs BP 108/68 (BP Location: Left Arm)   Pulse 69   Temp 98.3 F (36.8 C) (Oral)   Resp 18   LMP 05/03/2024 (Approximate)   SpO2 98%   Visual Acuity Right Eye Distance:   Left Eye Distance:   Bilateral Distance:    Right Eye Near:   Left Eye Near:    Bilateral Near:     Physical Exam Vitals and nursing note reviewed.  Constitutional:      General: Ariana Burns is not in acute distress.    Appearance: Normal appearance. Ariana Burns is not ill-appearing, toxic-appearing or diaphoretic.  Eyes:     General: No scleral icterus. Cardiovascular:     Rate and Rhythm: Normal rate and regular rhythm.     Heart sounds: Normal heart sounds.  Pulmonary:     Effort: Pulmonary effort is normal. No respiratory distress.     Breath sounds: Normal breath sounds. No wheezing or rhonchi.  Abdominal:     General: Abdomen is flat. Bowel sounds are normal.     Palpations: Abdomen is soft.     Tenderness: There is no abdominal tenderness. There is left CVA tenderness. There is no right CVA tenderness.  Skin:    General: Skin is warm.  Neurological:      Mental Status: Ariana Burns is alert and oriented to person, place, and time.  Psychiatric:  Mood and Affect: Mood normal.        Behavior: Behavior normal.      UC Treatments / Results  Labs (all labs ordered are listed, but only abnormal results are displayed) Labs Reviewed  POCT URINE DIPSTICK - Abnormal; Notable for the following components:      Result Value   Blood, UA trace-intact (*)    All other components within normal limits    EKG   Radiology No results found.  Procedures Procedures (including critical care time)  Medications Ordered in UC Medications - No data to display  Initial Impression / Assessment and Plan / UC Course  I have reviewed the triage vital signs and the nursing notes.  Pertinent labs & imaging results that were available during my care of the patient were reviewed by me and considered in my medical decision making (see chart for details).    Patient was diagnosed with renal colic on left side see discharge instructions for further explanation of course of visit. Final Clinical Impressions(s) / UC Diagnoses   Final diagnoses:  Renal colic on left side     Discharge Instructions      Symptoms that you have today are concerning for kidney stone.  Will prescribe Flomax that will blood in the lumen of the twos between your kidney and your bladder so the kidney stone can pass.  I will give you a strainer as you can strain your urine.  Given about 7 days, you should see some sand pass for the strainer or experience pain relief.  You should also increase your water intake to half a gallon to a gallon of water a day and drink no sugary drinks or soda.  If you do not experience either of those things we need to call urology for further evaluation.    ED Prescriptions     Medication Sig Dispense Auth. Provider   tamsulosin (FLOMAX) 0.4 MG CAPS capsule Take 1 capsule (0.4 mg total) by mouth daily for 10 days. 10 capsule Andra Krabbe C,  PA-C   diclofenac (VOLTAREN) 75 MG EC tablet Take 1 tablet (75 mg total) by mouth 2 (two) times daily. 30 tablet Andra Krabbe BROCKS, PA-C      PDMP not reviewed this encounter.   Andra Krabbe BROCKS, PA-C 05/24/24 1350

## 2024-07-20 ENCOUNTER — Ambulatory Visit
Admission: EM | Admit: 2024-07-20 | Discharge: 2024-07-20 | Disposition: A | Discharge status: Home / Self Care | Payer: Self-pay | Attending: Family Medicine | Admitting: Family Medicine

## 2024-07-20 ENCOUNTER — Encounter: Payer: Self-pay | Admitting: Emergency Medicine

## 2024-07-20 ENCOUNTER — Emergency Department (HOSPITAL_COMMUNITY)
Admission: EM | Admit: 2024-07-20 | Discharge: 2024-07-20 | Discharge status: Against Medical Advice | Payer: Self-pay | Attending: Emergency Medicine | Admitting: Emergency Medicine

## 2024-07-20 DIAGNOSIS — I517 Cardiomegaly: Secondary | ICD-10-CM | POA: Insufficient documentation

## 2024-07-20 DIAGNOSIS — R509 Fever, unspecified: Secondary | ICD-10-CM | POA: Insufficient documentation

## 2024-07-20 DIAGNOSIS — R519 Headache, unspecified: Secondary | ICD-10-CM | POA: Insufficient documentation

## 2024-07-20 DIAGNOSIS — R070 Pain in throat: Secondary | ICD-10-CM | POA: Insufficient documentation

## 2024-07-20 DIAGNOSIS — M542 Cervicalgia: Secondary | ICD-10-CM | POA: Insufficient documentation

## 2024-07-20 DIAGNOSIS — F640 Transsexualism: Secondary | ICD-10-CM | POA: Insufficient documentation

## 2024-07-20 DIAGNOSIS — E785 Hyperlipidemia, unspecified: Secondary | ICD-10-CM | POA: Insufficient documentation

## 2024-07-20 DIAGNOSIS — Z5321 Procedure and treatment not carried out due to patient leaving prior to being seen by health care provider: Secondary | ICD-10-CM | POA: Insufficient documentation

## 2024-07-20 DIAGNOSIS — J111 Influenza due to unidentified influenza virus with other respiratory manifestations: Secondary | ICD-10-CM | POA: Insufficient documentation

## 2024-07-20 LAB — POCT INFLUENZA A/B
Influenza A, POC: NEGATIVE
Influenza B, POC: NEGATIVE

## 2024-07-20 LAB — POC SOFIA SARS ANTIGEN FIA: SARS Coronavirus 2 Ag: NEGATIVE

## 2024-07-20 LAB — POCT RAPID STREP A (OFFICE): Rapid Strep A Screen: NEGATIVE

## 2024-07-20 MED ORDER — PREDNISONE 20 MG PO TABS: 40.0000 mg | ORAL_TABLET | Freq: Every day | ORAL | 0 refills | Status: AC

## 2024-07-20 MED ORDER — ACETAMINOPHEN 325 MG PO TABS
650.0000 mg | ORAL_TABLET | Freq: Once | ORAL | Status: AC
  Administered 2024-07-20: 650 mg via ORAL

## 2024-07-20 MED ORDER — ALBUTEROL SULFATE HFA 108 (90 BASE) MCG/ACT IN AERS: 2.0000 | INHALATION_SPRAY | RESPIRATORY_TRACT | 0 refills | Status: AC | PRN

## 2024-07-20 MED ORDER — OSELTAMIVIR PHOSPHATE 75 MG PO CAPS: 75.0000 mg | ORAL_CAPSULE | Freq: Two times a day (BID) | ORAL | 0 refills | Status: AC

## 2024-07-20 NOTE — ED Notes (Signed)
 Pt decided to go to UC didn't want to sit for hours

## 2024-07-20 NOTE — ED Triage Notes (Signed)
 Pt reports fever, headache, sore throat, productive cough, and body aches that started yesterday. Max temp: 102 over night. No sick contacts. Nyquil taken with little relief. Body aches all over but worse in neck.

## 2024-07-20 NOTE — Discharge Instructions (Addendum)
 The testing for flu and COVID was negative  Your strep test is negative.  Culture of the throat will be sent, and staff will notify you if that is in turn positive.  I want to treat you for influenza-like illness:  Take oseltamivir  75 mg--1 capsule 2 times daily for 5 days  Albuterol  inhaler--do 2 puffs every 4 hours as needed for shortness of breath or wheezing  Take prednisone  20 mg--2 daily for 5 days  Drink plenty of fluids

## 2024-07-20 NOTE — ED Provider Notes (Signed)
 " [facility]    CSN: [id number] Arrival date & time: 07/20/24  9164      History   Chief Complaint Chief Complaint  Patient presents with   Fever   Headache   Cough   Generalized Body Aches    HPI Pietrina Jagodzinski is a 28 y.o. adult.    Fever Associated symptoms: cough and headaches   Headache Associated symptoms: cough and fever   Cough Associated symptoms: fever and headaches   Here for cough and headache and fever and myalgias that began yesterday.  The cough is mild and he mostly has some sore throat.  Not really much nasal drainage.  NKDA  They have had no vomiting or diarrhea. They have had a wheezy cough since this began and has wheezed in the past and had bronchitis in the past.  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 No pertinent surgical history.  OB History     Gravida  0   Para      Term      Preterm      AB      Living         SAB      IAB      Ectopic      Multiple      Live Births               Home Medications    Prior to Admission medications  Medication Sig Start Date End Date Taking? Authorizing Provider   albuterol  (VENTOLIN  HFA) 108 (90 Base) MCG/ACT inhaler Inhale 2 puffs into the lungs every 4 (four) hours as needed for wheezing or shortness of breath. 07/20/24  Yes Vonna Sharlet POUR, MD  oseltamivir  (TAMIFLU ) 75 MG capsule Take 1 capsule (75 mg total) by mouth every 12 (twelve) hours. 07/20/24  Yes Vonna Sharlet POUR, MD  predniSONE  (DELTASONE ) 20 MG tablet Take 2 tablets (40 mg total) by mouth daily with breakfast for 5 days. 07/20/24 07/25/24 Yes Vonna Sharlet POUR, MD  ARIPiprazole  (ABILIFY ) 5 MG tablet Take 1 tablet (5 mg total) by mouth daily at 8 pm. Patient not taking: Reported on 05/24/2024 11/15/21 12/15/21  Johny Lot, MD  FLUoxetine  (PROZAC ) 20 MG capsule Take 1 capsule (20 mg total) by mouth daily. Patient not taking: Reported on 05/24/2024 11/16/21 12/16/21  Johny Lot, MD  gabapentin  (NEURONTIN ) 100 MG capsule Take 1 capsule (100 mg total) by mouth 3 (three) times daily. Patient not taking: Reported on 05/24/2024 11/15/21 12/15/21  Johny Lot, MD  topiramate  (TOPAMAX ) 50 MG tablet Take 1 tablet (50 mg total) by mouth daily. Patient not taking: Reported on 05/24/2024 11/16/21 12/16/21  Johny Lot, MD    Family History Family  History  Problem Relation Age of Onset   Depression Mother    Depression Father    Hypertension Maternal Grandfather     Social History Social History[1]   Allergies   Patient has no known allergies.   Review of Systems Review of Systems  Constitutional:  Positive for fever.  Respiratory:  Positive for cough.   Neurological:  Positive for headaches.     Physical Exam Triage Vital Signs ED Triage Vitals  Encounter Vitals Group     BP 07/20/24 0955 103/71     Girls Systolic BP Percentile --      Girls Diastolic BP Percentile --      Boys Systolic BP Percentile --      Boys Diastolic BP Percentile --      Pulse Rate 07/20/24 0955 96     Resp 07/20/24 0955 18     Temp 07/20/24 0955 (!) 100.8 F (38.2 C)     Temp  Source 07/20/24 0955 Oral     SpO2 07/20/24 0955 99 %     Weight --      Height --      Head Circumference --      Peak Flow --      Pain Score 07/20/24 0954 9     Pain Loc --      Pain Education --      Exclude from Growth Chart --    No data found.  Updated Vital Signs BP 103/71 (BP Location: Left Arm)   Pulse 96   Temp (!) 100.8 F (38.2 C) (Oral)   Resp 18   LMP 07/14/2024 (Approximate)   SpO2 99%   Visual Acuity Right Eye Distance:   Left Eye Distance:   Bilateral Distance:    Right Eye Near:   Left Eye Near:    Bilateral Near:     Physical Exam   UC Treatments / Results  Labs (all labs ordered are listed, but only abnormal results are displayed) Labs Reviewed  CULTURE, GROUP A STREP ([facility])  POCT INFLUENZA A/B  POC SOFIA SARS ANTIGEN FIA  POCT RAPID STREP A (OFFICE)    EKG   Radiology No results found.  Procedures Procedures (including critical care time)  Medications Ordered in UC Medications  acetaminophen  (TYLENOL ) tablet 650 mg (650 mg Oral Given 07/20/24 0959)    Initial Impression / Assessment and Plan / UC Course  I have reviewed the triage vital signs and the nursing notes.  Pertinent labs & imaging results that were available during my care of the patient were reviewed by me and considered in my medical decision making (see chart for details).     Testing for flu and COVID is negative  Rapid strep is negative.  Throat culture is sent and we will notify and treat protocol if that is positive  There has been a large burden of flu in the community at this time and with his having fever and aches I think I should treat him for influenza-like illness.  Tamiflu  was therefore sent in  Also I am sending in albuterol  and prednisone  for possible asthma exacerbation. Final Clinical Impressions(s) / UC Diagnoses   Final diagnoses:  Fever, unspecified  Throat pain  Influenza-like illness     Discharge Instructions      The testing  for flu and COVID was negative  Your strep test is negative.  Culture of the throat will be sent, and staff will notify you if that is in turn  positive.  I want to treat you for influenza-like illness:  Take oseltamivir  75 mg--1 capsule 2 times daily for 5 days  Albuterol  inhaler--do 2 puffs every 4 hours as needed for shortness of breath or wheezing  Take prednisone  20 mg--2 daily for 5 days  Drink plenty of fluids     ED Prescriptions     Medication Sig Dispense Auth. Provider   albuterol  (VENTOLIN  HFA) 108 (90 Base) MCG/ACT inhaler Inhale 2 puffs into the lungs every 4 (four) hours as needed for wheezing or shortness of breath. 1 each Vonna Sharlet POUR, MD   oseltamivir  (TAMIFLU ) 75 MG capsule Take 1 capsule (75 mg total) by mouth every 12 (twelve) hours. 10 capsule Vonna Sharlet POUR, MD   predniSONE  (DELTASONE ) 20 MG tablet Take 2 tablets (40 mg total) by mouth daily with breakfast for 5 days. 10 tablet Vonna Dorien Bessent K, MD      PDMP not reviewed this encounter.    [1]  Social History Tobacco Use   Smoking status: Former    Current packs/day: 0.25    Types: Cigarettes   Smokeless tobacco: Never  Vaping Use   Vaping status: Every Day   Devices: I vape nicotine , delta 8 and marijuana  Substance Use Topics   Alcohol use: Not Currently    Alcohol/week: 6.0 standard drinks of alcohol    Types: 6 Shots of liquor per week    Comment: I take 5-6 shots about 5-7 days / week   Drug use: Not Currently    Types: Marijuana     Vonna Sharlet POUR, MD 07/20/24 1148  "

## 2024-07-23 ENCOUNTER — Ambulatory Visit (HOSPITAL_COMMUNITY): Payer: Self-pay

## 2024-07-23 LAB — CULTURE, GROUP A STREP (THRC)
